# Patient Record
Sex: Female | Born: 1955 | Race: White | Hispanic: No | Marital: Married | Smoking: Never smoker
Health system: Southern US, Community
[De-identification: ages and names within clinical notes are randomized; demographics above are authoritative.]

## PROBLEM LIST (undated history)

## (undated) DIAGNOSIS — I10 Essential (primary) hypertension: Secondary | ICD-10-CM

## (undated) DIAGNOSIS — E119 Type 2 diabetes mellitus without complications: Secondary | ICD-10-CM

## (undated) DIAGNOSIS — M5412 Radiculopathy, cervical region: Secondary | ICD-10-CM

## (undated) DIAGNOSIS — E039 Hypothyroidism, unspecified: Secondary | ICD-10-CM

## (undated) DIAGNOSIS — M7540 Impingement syndrome of unspecified shoulder: Secondary | ICD-10-CM

## (undated) DIAGNOSIS — E785 Hyperlipidemia, unspecified: Secondary | ICD-10-CM

## (undated) DIAGNOSIS — M1990 Unspecified osteoarthritis, unspecified site: Secondary | ICD-10-CM

## (undated) DIAGNOSIS — E7800 Pure hypercholesterolemia, unspecified: Secondary | ICD-10-CM

## (undated) HISTORY — PX: CERVICAL SPINE SURGERY: SHX589

## (undated) HISTORY — PX: OTHER SURGICAL HISTORY: SHX169

## (undated) HISTORY — PX: BREAST EXCISIONAL BIOPSY: SUR124

## (undated) HISTORY — PX: APPENDECTOMY: SHX54

## (undated) HISTORY — PX: COLONOSCOPY: SHX174

## (undated) HISTORY — PX: DILATION AND CURETTAGE OF UTERUS: SHX78

## (undated) HISTORY — PX: CHOLECYSTECTOMY: SHX55

## (undated) HISTORY — PX: ABDOMINAL HYSTERECTOMY: SHX81

---

## 2003-09-06 ENCOUNTER — Other Ambulatory Visit: Payer: Self-pay

## 2005-04-26 ENCOUNTER — Ambulatory Visit: Payer: Self-pay | Admitting: Unknown Physician Specialty

## 2006-01-09 ENCOUNTER — Ambulatory Visit: Payer: Self-pay | Admitting: General Surgery

## 2006-01-17 ENCOUNTER — Other Ambulatory Visit: Payer: Self-pay

## 2006-01-24 ENCOUNTER — Ambulatory Visit: Payer: Self-pay | Admitting: General Surgery

## 2007-11-21 ENCOUNTER — Ambulatory Visit: Payer: Self-pay | Admitting: General Surgery

## 2007-12-09 ENCOUNTER — Ambulatory Visit: Payer: Self-pay | Admitting: Unknown Physician Specialty

## 2008-04-05 ENCOUNTER — Ambulatory Visit: Payer: Self-pay | Admitting: Unknown Physician Specialty

## 2008-12-07 ENCOUNTER — Ambulatory Visit: Payer: Self-pay | Admitting: Unknown Physician Specialty

## 2008-12-12 ENCOUNTER — Ambulatory Visit: Payer: Self-pay | Admitting: Internal Medicine

## 2009-12-15 ENCOUNTER — Ambulatory Visit: Payer: Self-pay | Admitting: Unknown Physician Specialty

## 2011-02-07 ENCOUNTER — Ambulatory Visit: Payer: Self-pay | Admitting: Unknown Physician Specialty

## 2011-03-22 ENCOUNTER — Ambulatory Visit: Payer: Self-pay | Admitting: Unknown Physician Specialty

## 2012-03-13 ENCOUNTER — Ambulatory Visit: Payer: Self-pay | Admitting: Family Medicine

## 2013-03-16 ENCOUNTER — Ambulatory Visit: Payer: Self-pay | Admitting: Family Medicine

## 2013-07-06 ENCOUNTER — Ambulatory Visit: Payer: Self-pay | Admitting: Unknown Physician Specialty

## 2014-03-15 DIAGNOSIS — I10 Essential (primary) hypertension: Secondary | ICD-10-CM | POA: Insufficient documentation

## 2014-03-15 DIAGNOSIS — E039 Hypothyroidism, unspecified: Secondary | ICD-10-CM | POA: Insufficient documentation

## 2014-03-15 DIAGNOSIS — E119 Type 2 diabetes mellitus without complications: Secondary | ICD-10-CM | POA: Insufficient documentation

## 2014-04-28 ENCOUNTER — Ambulatory Visit: Payer: Self-pay | Admitting: Family Medicine

## 2014-11-30 ENCOUNTER — Encounter: Payer: Self-pay | Admitting: *Deleted

## 2014-12-02 ENCOUNTER — Ambulatory Visit
Admit: 2014-12-02 | Disposition: A | Discharge status: Home / Self Care | Payer: Self-pay | Attending: Orthopedic Surgery | Admitting: Orthopedic Surgery

## 2014-12-20 ENCOUNTER — Other Ambulatory Visit
Admission: RE | Admit: 2014-12-20 | Discharge: 2014-12-20 | Disposition: A | Discharge status: Home / Self Care | Payer: 59 | Source: Other Acute Inpatient Hospital | Attending: Neurosurgery | Admitting: Neurosurgery

## 2014-12-20 DIAGNOSIS — M4802 Spinal stenosis, cervical region: Secondary | ICD-10-CM | POA: Insufficient documentation

## 2014-12-20 LAB — BASIC METABOLIC PANEL
Anion gap: 11 (ref 5–15)
BUN: 27 mg/dL — ABNORMAL HIGH (ref 6–20)
CHLORIDE: 109 mmol/L (ref 101–111)
CO2: 21 mmol/L — ABNORMAL LOW (ref 22–32)
CREATININE: 1.17 mg/dL — AB (ref 0.44–1.00)
Calcium: 9.4 mg/dL (ref 8.9–10.3)
GFR calc Af Amer: 58 mL/min — ABNORMAL LOW (ref >60)
GFR, EST NON AFRICAN AMERICAN: 50 mL/min — AB (ref >60)
Glucose, Bld: 128 mg/dL — ABNORMAL HIGH (ref 65–99)
POTASSIUM: 3.7 mmol/L (ref 3.5–5.1)
Sodium: 141 mmol/L (ref 135–145)

## 2014-12-20 LAB — CBC WITH DIFFERENTIAL/PLATELET
BASOS ABS: 0 10*3/uL (ref 0–0.1)
Basophils Relative: 1 %
EOS ABS: 0.4 10*3/uL (ref 0–0.7)
Eosinophils Relative: 6 %
HCT: 39.5 % (ref 35.0–47.0)
HEMOGLOBIN: 12.7 g/dL (ref 12.0–16.0)
LYMPHS ABS: 1.6 10*3/uL (ref 1.0–3.6)
LYMPHS PCT: 24 %
MCH: 28.7 pg (ref 26.0–34.0)
MCHC: 32.3 g/dL (ref 32.0–36.0)
MCV: 88.8 fL (ref 80.0–100.0)
MONO ABS: 0.5 10*3/uL (ref 0.2–0.9)
Monocytes Relative: 8 %
NEUTROS ABS: 4.1 10*3/uL (ref 1.4–6.5)
Neutrophils Relative %: 61 %
PLATELETS: 274 10*3/uL (ref 150–440)
RBC: 4.45 MIL/uL (ref 3.80–5.20)
RDW: 14.1 % (ref 11.5–14.5)
WBC: 6.8 10*3/uL (ref 3.6–11.0)

## 2015-04-18 ENCOUNTER — Other Ambulatory Visit: Payer: Self-pay | Admitting: Family Medicine

## 2015-04-18 DIAGNOSIS — Z1239 Encounter for other screening for malignant neoplasm of breast: Secondary | ICD-10-CM

## 2015-05-02 ENCOUNTER — Other Ambulatory Visit: Payer: Self-pay | Admitting: Family Medicine

## 2015-05-02 ENCOUNTER — Ambulatory Visit
Admission: RE | Admit: 2015-05-02 | Discharge: 2015-05-02 | Disposition: A | Discharge status: Home / Self Care | Payer: 59 | Source: Ambulatory Visit | Attending: Family Medicine | Admitting: Family Medicine

## 2015-05-02 DIAGNOSIS — Z1231 Encounter for screening mammogram for malignant neoplasm of breast: Secondary | ICD-10-CM | POA: Diagnosis present

## 2015-05-02 DIAGNOSIS — Z1239 Encounter for other screening for malignant neoplasm of breast: Secondary | ICD-10-CM

## 2015-07-26 ENCOUNTER — Emergency Department
Admission: EM | Admit: 2015-07-26 | Discharge: 2015-07-26 | Disposition: A | Discharge status: Home / Self Care | Payer: 59 | Attending: Emergency Medicine | Admitting: Emergency Medicine

## 2015-07-26 ENCOUNTER — Encounter: Payer: Self-pay | Admitting: Emergency Medicine

## 2015-07-26 DIAGNOSIS — J029 Acute pharyngitis, unspecified: Secondary | ICD-10-CM | POA: Insufficient documentation

## 2015-07-26 DIAGNOSIS — E119 Type 2 diabetes mellitus without complications: Secondary | ICD-10-CM | POA: Diagnosis not present

## 2015-07-26 DIAGNOSIS — Z7984 Long term (current) use of oral hypoglycemic drugs: Secondary | ICD-10-CM | POA: Diagnosis not present

## 2015-07-26 DIAGNOSIS — Z7982 Long term (current) use of aspirin: Secondary | ICD-10-CM | POA: Diagnosis not present

## 2015-07-26 DIAGNOSIS — Z79899 Other long term (current) drug therapy: Secondary | ICD-10-CM | POA: Diagnosis not present

## 2015-07-26 HISTORY — DX: Type 2 diabetes mellitus without complications: E11.9

## 2015-07-26 LAB — POCT RAPID STREP A: STREPTOCOCCUS, GROUP A SCREEN (DIRECT): NEGATIVE

## 2015-07-26 MED ORDER — IBUPROFEN 800 MG PO TABS
800.0000 mg | ORAL_TABLET | Freq: Once | ORAL | Status: AC
Start: 2015-07-26 — End: 2015-07-26
  Administered 2015-07-26: 800 mg via ORAL
  Filled 2015-07-26: qty 1

## 2015-07-26 MED ORDER — AMOXICILLIN-POT CLAVULANATE 875-125 MG PO TABS: 1.0000 | ORAL_TABLET | Freq: Two times a day (BID) | ORAL | Status: AC

## 2015-07-26 MED ORDER — AMOXICILLIN-POT CLAVULANATE 875-125 MG PO TABS
1.0000 | ORAL_TABLET | Freq: Once | ORAL | Status: AC
  Administered 2015-07-26: 1 via ORAL

## 2015-07-26 MED ORDER — PREDNISONE 20 MG PO TABS: 40.0000 mg | ORAL_TABLET | Freq: Every day | ORAL | Status: AC

## 2015-07-26 MED ORDER — AMOXICILLIN-POT CLAVULANATE 875-125 MG PO TABS
ORAL_TABLET | ORAL | Status: AC
  Filled 2015-07-26: qty 1

## 2015-07-26 MED ORDER — PREDNISONE 20 MG PO TABS
60.0000 mg | ORAL_TABLET | Freq: Once | ORAL | Status: AC
  Administered 2015-07-26: 60 mg via ORAL

## 2015-07-26 MED ORDER — PREDNISONE 20 MG PO TABS
ORAL_TABLET | ORAL | Status: AC
  Administered 2015-07-26: 60 mg via ORAL
  Filled 2015-07-26: qty 3

## 2015-07-26 NOTE — Discharge Instructions (Signed)

## 2015-07-26 NOTE — ED Notes (Signed)
Patient ambulatory to triage with steady gait, without difficulty or distress noted; pt reports cold symptoms since Friday, awoke with sore throat

## 2015-07-26 NOTE — ED Notes (Signed)
MD at bedside. 

## 2015-07-26 NOTE — ED Provider Notes (Signed)
[facility] Emergency Department Provider Note  ____________________________________________  Time seen: 6:15 AM  I have reviewed the triage vital signs and the nursing notes.   HISTORY  Chief Complaint Sore Throat      HPI Amanda Hill is a 59 y.o. female presents with sore throat cough 3 days with acute worsening of her sore throat last night. Patient admits to chills however denies any fever or afebrile on presentation emergency Department temperature 98.3. Patient states she awoke this morning with the sensation as that her throat was closing with considerable discomfort with swallowing     Past Medical History  Diagnosis Date  . Diabetes mellitus without complication ([place])     There are no active problems to display for this patient.   Past Surgical History  Procedure Laterality Date  . Breast biopsy Right [id number]    neg    Current Outpatient Rx  Name  Route  Sig  Dispense  Refill  . aspirin EC 81 MG tablet   Oral   Take 81 mg by mouth daily.         Marland Kitchen levothyroxine (SYNTHROID, LEVOTHROID) 150 MCG tablet   Oral   Take 150 mcg by mouth daily before breakfast.         . linagliptin (TRADJENTA) 5 MG TABS tablet   Oral   Take 5 mg by mouth daily.         Marland Kitchen lisinopril-hydrochlorothiazide (PRINZIDE,ZESTORETIC) 10-12.5 MG tablet   Oral   Take 1 tablet by mouth daily.         . metFORMIN (GLUCOPHAGE) 1000 MG tablet   Oral   Take 1,000 mg by mouth 2 (two) times daily.         . simvastatin (ZOCOR) 10 MG tablet   Oral   Take 10 mg by mouth daily.         Marland Kitchen amoxicillin-clavulanate (AUGMENTIN) 875-125 MG tablet   Oral   Take 1 tablet by mouth 2 (two) times daily.   20 tablet   0   . predniSONE (DELTASONE) 20 MG tablet   Oral   Take 2 tablets (40 mg total) by mouth daily with breakfast.   8 tablet   0     Allergies Dapsone  Family History  Problem Relation Age of Onset  . Breast cancer Maternal  Grandmother 60    Social History Social History  Substance Use Topics  . Smoking status: Never Smoker   . Smokeless tobacco: None  . Alcohol Use: No    Review of Systems  Constitutional: Negative for fever. Positive for chills Eyes: Negative for visual changes. ENT: Positive for sore throat. Cardiovascular: Negative for chest pain. Respiratory: Negative for shortness of breath. Gastrointestinal: Negative for abdominal pain, vomiting and diarrhea. Genitourinary: Negative for dysuria. Musculoskeletal: Negative for back pain. Skin: Negative for rash. Neurological: Negative for headaches, focal weakness or numbness.   10-point ROS otherwise negative.  ____________________________________________   PHYSICAL EXAM:  VITAL SIGNS: ED Triage Vitals  Enc Vitals Group     BP 07/26/15 0417 105/73 mmHg     Pulse Rate 07/26/15 0417 92     Resp 07/26/15 0417 20     Temp 07/26/15 0417 98.3 F (36.8 C)     Temp Source 07/26/15 0417 Oral     SpO2 07/26/15 0417 97 %     Weight 07/26/15 0417 210 lb (95.255 kg)     Height 07/26/15 0417 5\' 3"  (1.6 m)  Head Cir --      Peak Flow --      Pain Score 07/26/15 0417 6     Pain Loc --      Pain Edu? --      Excl. in [place]? --     Constitutional: Alert and oriented. Well appearing and in no distress. Eyes: Conjunctivae are normal. PERRL. Normal extraocular movements. ENT   Head: Normocephalic and atraumatic.   Nose: No congestion/rhinnorhea.   Mouth/Throat: Mucous membranes are moist. Pharyngeal erythema with exudate   Neck: No stridor. Hematological/Lymphatic/Immunilogical: No cervical lymphadenopathy. Cardiovascular: Normal rate, regular rhythm. Normal and symmetric distal pulses are present in all extremities. No murmurs, rubs, or gallops. Respiratory: Normal respiratory effort without tachypnea nor retractions. Breath sounds are clear and equal bilaterally. No wheezes/rales/rhonchi. Gastrointestinal: Soft and nontender.  No distention. There is no CVA tenderness. Genitourinary: deferred Musculoskeletal: Nontender with normal range of motion in all extremities. No joint effusions.  No lower extremity tenderness nor edema. Neurologic:  Normal speech and language. No gross focal neurologic deficits are appreciated. Speech is normal.  Skin:  Skin is warm, dry and intact. No rash noted. Psychiatric: Mood and affect are normal. Speech and behavior are normal. Patient exhibits appropriate insight and judgment.  ____________________________________________    LABS (pertinent positives/negatives) Labs Reviewed  CULTURE, GROUP A STREP ([facility] ONLY)  POCT RAPID STREP A      INITIAL IMPRESSION / ASSESSMENT AND PLAN / ED COURSE  Pertinent labs & imaging results that were available during my care of the patient were reviewed by me and considered in my medical decision making (see chart for details).  History of physical exam consistent with pharyngitis. Given the level of patient's discomfort was prednisone was given for inflammation. Patient was advised to be very diligent regarding checking her glucose and treating accordingly. Patient is an Therapist, sports. In addition patient received Augmentin 875 mg and will be prescribe the same  ____________________________________________   FINAL CLINICAL IMPRESSION(S) / ED DIAGNOSES  Final diagnoses:  Acute pharyngitis, unspecified pharyngitis type      Gregor Hams, MD 07/26/15 [phone]

## 2015-07-26 NOTE — ED Notes (Signed)
Patient with no complaints at this time. Respirations even and unlabored. Skin warm/dry. Discharge instructions reviewed with patient at this time. Patient given opportunity to voice concerns/ask questions. Patient discharged at this time and left Emergency Department with steady gait.   

## 2015-07-28 LAB — CULTURE, GROUP A STREP (THRC)

## 2015-10-11 DIAGNOSIS — E119 Type 2 diabetes mellitus without complications: Secondary | ICD-10-CM | POA: Diagnosis not present

## 2015-10-11 DIAGNOSIS — I10 Essential (primary) hypertension: Secondary | ICD-10-CM | POA: Diagnosis not present

## 2015-10-19 DIAGNOSIS — E119 Type 2 diabetes mellitus without complications: Secondary | ICD-10-CM | POA: Diagnosis not present

## 2015-10-19 DIAGNOSIS — I10 Essential (primary) hypertension: Secondary | ICD-10-CM | POA: Diagnosis not present

## 2015-10-19 DIAGNOSIS — E785 Hyperlipidemia, unspecified: Secondary | ICD-10-CM | POA: Diagnosis not present

## 2015-10-19 DIAGNOSIS — E039 Hypothyroidism, unspecified: Secondary | ICD-10-CM | POA: Diagnosis not present

## 2015-10-31 DIAGNOSIS — I10 Essential (primary) hypertension: Secondary | ICD-10-CM | POA: Diagnosis not present

## 2016-02-17 DIAGNOSIS — H2512 Age-related nuclear cataract, left eye: Secondary | ICD-10-CM | POA: Diagnosis not present

## 2016-03-22 DIAGNOSIS — H2512 Age-related nuclear cataract, left eye: Secondary | ICD-10-CM | POA: Diagnosis not present

## 2016-03-29 ENCOUNTER — Ambulatory Visit: Payer: 59 | Admitting: Anesthesiology

## 2016-03-29 ENCOUNTER — Encounter
Admission: RE | Disposition: A | Discharge status: Home / Self Care | Payer: Self-pay | Source: Ambulatory Visit | Attending: Ophthalmology

## 2016-03-29 ENCOUNTER — Encounter: Payer: Self-pay | Admitting: Anesthesiology

## 2016-03-29 ENCOUNTER — Ambulatory Visit
Admission: RE | Admit: 2016-03-29 | Discharge: 2016-03-29 | Disposition: A | Discharge status: Home / Self Care | Payer: 59 | Source: Ambulatory Visit | Attending: Ophthalmology | Admitting: Ophthalmology

## 2016-03-29 DIAGNOSIS — M1990 Unspecified osteoarthritis, unspecified site: Secondary | ICD-10-CM | POA: Insufficient documentation

## 2016-03-29 DIAGNOSIS — E7800 Pure hypercholesterolemia, unspecified: Secondary | ICD-10-CM | POA: Insufficient documentation

## 2016-03-29 DIAGNOSIS — I10 Essential (primary) hypertension: Secondary | ICD-10-CM | POA: Diagnosis not present

## 2016-03-29 DIAGNOSIS — E119 Type 2 diabetes mellitus without complications: Secondary | ICD-10-CM | POA: Diagnosis not present

## 2016-03-29 DIAGNOSIS — E1136 Type 2 diabetes mellitus with diabetic cataract: Secondary | ICD-10-CM | POA: Insufficient documentation

## 2016-03-29 DIAGNOSIS — E669 Obesity, unspecified: Secondary | ICD-10-CM | POA: Diagnosis not present

## 2016-03-29 DIAGNOSIS — Z9049 Acquired absence of other specified parts of digestive tract: Secondary | ICD-10-CM | POA: Insufficient documentation

## 2016-03-29 DIAGNOSIS — H2512 Age-related nuclear cataract, left eye: Secondary | ICD-10-CM | POA: Diagnosis not present

## 2016-03-29 DIAGNOSIS — Z6837 Body mass index (BMI) 37.0-37.9, adult: Secondary | ICD-10-CM | POA: Diagnosis not present

## 2016-03-29 DIAGNOSIS — Z888 Allergy status to other drugs, medicaments and biological substances status: Secondary | ICD-10-CM | POA: Diagnosis not present

## 2016-03-29 DIAGNOSIS — Z90710 Acquired absence of both cervix and uterus: Secondary | ICD-10-CM | POA: Diagnosis not present

## 2016-03-29 DIAGNOSIS — E079 Disorder of thyroid, unspecified: Secondary | ICD-10-CM | POA: Diagnosis not present

## 2016-03-29 HISTORY — DX: Essential (primary) hypertension: I10

## 2016-03-29 HISTORY — PX: CATARACT EXTRACTION W/PHACO: SHX586

## 2016-03-29 HISTORY — DX: Unspecified osteoarthritis, unspecified site: M19.90

## 2016-03-29 HISTORY — DX: Pure hypercholesterolemia, unspecified: E78.00

## 2016-03-29 HISTORY — DX: Hypothyroidism, unspecified: E03.9

## 2016-03-29 LAB — GLUCOSE, CAPILLARY: GLUCOSE-CAPILLARY: 122 mg/dL — AB (ref 65–99)

## 2016-03-29 SURGERY — PHACOEMULSIFICATION, CATARACT, WITH IOL INSERTION
Anesthesia: Monitor Anesthesia Care | Site: Eye | Laterality: Left | Wound class: Clean

## 2016-03-29 MED ORDER — CEFUROXIME OPHTHALMIC INJECTION 1 MG/0.1 ML
INJECTION | OPHTHALMIC | Status: DC | PRN
  Administered 2016-03-29: .1 mL via INTRACAMERAL

## 2016-03-29 MED ORDER — CEFUROXIME OPHTHALMIC INJECTION 1 MG/0.1 ML
INJECTION | OPHTHALMIC | Status: AC
  Filled 2016-03-29: qty 0.1

## 2016-03-29 MED ORDER — LIDOCAINE HCL (PF) 4 % IJ SOLN
INTRAMUSCULAR | Status: AC
  Filled 2016-03-29: qty 5

## 2016-03-29 MED ORDER — POVIDONE-IODINE 5 % OP SOLN
1.0000 "application " | OPHTHALMIC | Status: AC | PRN
  Administered 2016-03-29: 1 via OPHTHALMIC

## 2016-03-29 MED ORDER — NA CHONDROIT SULF-NA HYALURON 40-17 MG/ML IO SOLN
INTRAOCULAR | Status: DC | PRN
  Administered 2016-03-29: 1 mL via INTRAOCULAR

## 2016-03-29 MED ORDER — MIDAZOLAM HCL 2 MG/2ML IJ SOLN
INTRAMUSCULAR | Status: DC | PRN
  Administered 2016-03-29: 2 mg via INTRAVENOUS

## 2016-03-29 MED ORDER — EPINEPHRINE HCL 1 MG/ML IJ SOLN
INTRAMUSCULAR | Status: AC
  Filled 2016-03-29: qty 2

## 2016-03-29 MED ORDER — SODIUM CHLORIDE 0.9 % IV SOLN
INTRAVENOUS | Status: DC
  Administered 2016-03-29: 11:00:00 via INTRAVENOUS

## 2016-03-29 MED ORDER — FENTANYL CITRATE (PF) 100 MCG/2ML IJ SOLN
INTRAMUSCULAR | Status: DC | PRN
  Administered 2016-03-29 (×2): 50 ug via INTRAVENOUS

## 2016-03-29 MED ORDER — POVIDONE-IODINE 5 % OP SOLN
OPHTHALMIC | Status: AC
  Filled 2016-03-29: qty 30

## 2016-03-29 MED ORDER — EPINEPHRINE HCL 1 MG/ML IJ SOLN
INTRAMUSCULAR | Status: DC | PRN
  Administered 2016-03-29: 1 mL via OPHTHALMIC

## 2016-03-29 MED ORDER — MOXIFLOXACIN HCL 0.5 % OP SOLN
1.0000 [drp] | OPHTHALMIC | Status: AC | PRN
  Administered 2016-03-29: 1 [drp] via OPHTHALMIC
  Filled 2016-03-29: qty 3

## 2016-03-29 MED ORDER — BSS IO SOLN
INTRAOCULAR | Status: DC | PRN
  Administered 2016-03-29: .5 mL via OPHTHALMIC

## 2016-03-29 MED ORDER — NA CHONDROIT SULF-NA HYALURON 40-17 MG/ML IO SOLN
INTRAOCULAR | Status: AC
  Filled 2016-03-29: qty 1

## 2016-03-29 MED ORDER — MOXIFLOXACIN HCL 0.5 % OP SOLN
OPHTHALMIC | Status: AC
  Filled 2016-03-29: qty 3

## 2016-03-29 MED ORDER — CARBACHOL 0.01 % IO SOLN
INTRAOCULAR | Status: DC | PRN
  Administered 2016-03-29: .5 mL via INTRAOCULAR

## 2016-03-29 MED ORDER — ARMC OPHTHALMIC DILATING GEL
1.0000 "application " | OPHTHALMIC | Status: AC | PRN
  Administered 2016-03-29 (×2): 1 via OPHTHALMIC

## 2016-03-29 MED ORDER — MOXIFLOXACIN HCL 0.5 % OP SOLN
OPHTHALMIC | Status: DC | PRN
  Administered 2016-03-29: 1 [drp] via OPHTHALMIC

## 2016-03-29 MED ORDER — TETRACAINE HCL 0.5 % OP SOLN
1.0000 [drp] | OPHTHALMIC | Status: AC | PRN
  Administered 2016-03-29: 1 [drp] via OPHTHALMIC

## 2016-03-29 MED ORDER — ARMC OPHTHALMIC DILATING GEL
OPHTHALMIC | Status: AC
  Filled 2016-03-29: qty 0.25

## 2016-03-29 MED ORDER — TETRACAINE HCL 0.5 % OP SOLN
OPHTHALMIC | Status: AC
  Filled 2016-03-29: qty 2

## 2016-03-29 SURGICAL SUPPLY — 21 items
CANNULA ANT/CHMB 27GA (MISCELLANEOUS) ×2 IMPLANT
CUP MEDICINE 2OZ PLAST GRAD ST (MISCELLANEOUS) ×2 IMPLANT
GLOVE BIO SURGEON STRL SZ8 (GLOVE) ×2 IMPLANT
GLOVE BIOGEL M 6.5 STRL (GLOVE) ×2 IMPLANT
GLOVE SURG LX 8.0 MICRO (GLOVE) ×1
GLOVE SURG LX STRL 8.0 MICRO (GLOVE) ×1 IMPLANT
GOWN STRL REUS W/ TWL LRG LVL3 (GOWN DISPOSABLE) ×2 IMPLANT
GOWN STRL REUS W/TWL LRG LVL3 (GOWN DISPOSABLE) ×2
LENS IOL TECNIS SYMFONY 14.5 (Intraocular Lens) ×2 IMPLANT
PACK CATARACT (MISCELLANEOUS) ×2 IMPLANT
PACK CATARACT BRASINGTON LX (MISCELLANEOUS) ×2 IMPLANT
PACK EYE AFTER SURG (MISCELLANEOUS) ×2 IMPLANT
SOL BSS BAG (MISCELLANEOUS) ×2
SOL PREP PVP 2OZ (MISCELLANEOUS) ×2
SOLUTION BSS BAG (MISCELLANEOUS) ×1 IMPLANT
SOLUTION PREP PVP 2OZ (MISCELLANEOUS) ×1 IMPLANT
SYR 3ML LL SCALE MARK (SYRINGE) ×2 IMPLANT
SYR 5ML LL (SYRINGE) ×2 IMPLANT
SYR TB 1ML 27GX1/2 LL (SYRINGE) ×2 IMPLANT
WATER STERILE IRR 1000ML POUR (IV SOLUTION) ×2 IMPLANT
WIPE NON LINTING 3.25X3.25 (MISCELLANEOUS) ×2 IMPLANT

## 2016-03-29 NOTE — H&P (Signed)
  All labs reviewed. Abnormal studies sent to patients PCP when indicated.  Previous H&P reviewed, patient examined, there are NO CHANGES.  Amanda Hill LOUIS8/10/201711:05 AM

## 2016-03-29 NOTE — Anesthesia Postprocedure Evaluation (Signed)
Anesthesia Post Note  Patient: Amanda Hill  Procedure(s) Performed: Procedure(s) (LRB): CATARACT EXTRACTION PHACO AND INTRAOCULAR LENS PLACEMENT (IOC) (Left)  Patient location during evaluation: Other Anesthesia Type: MAC Level of consciousness: awake and alert Pain management: pain level controlled Vital Signs Assessment: post-procedure vital signs reviewed and stable Respiratory status: spontaneous breathing, nonlabored ventilation, respiratory function stable and patient connected to nasal cannula oxygen Cardiovascular status: stable and blood pressure returned to baseline Anesthetic complications: no    Last Vitals:  Vitals:   03/29/16 1149 03/29/16 1200  BP: 100/71 106/61  Pulse: 65 66  Resp: 18 18  Temp:      Last Pain:  Vitals:   03/29/16 1200  TempSrc:   PainSc: 0-No pain                 Borghild Thaker S

## 2016-03-29 NOTE — Op Note (Signed)
PREOPERATIVE DIAGNOSIS:  Nuclear sclerotic cataract of the left eye.   POSTOPERATIVE DIAGNOSIS:  nuclear sclerotic cataract left eye   OPERATIVE PROCEDURE:  Procedure(s): CATARACT EXTRACTION PHACO AND INTRAOCULAR LENS PLACEMENT (IOC)   SURGEON:  Birder Robson, MD.   ANESTHESIA:   Anesthesiologist: Gunnar Bulla, MD CRNA: Vaughan Sine  1.      Managed anesthesia care. 2.      Topical tetracaine drops followed by 2% Xylocaine jelly applied in the preoperative holding area.       3.  0.2 ml of epi-Shugarcaine was  placed in the anterior chamber following the paracentesis.    COMPLICATIONS:  None.   TECHNIQUE:   Stop and chop   DESCRIPTION OF PROCEDURE:  The patient was examined and consented in the preoperative holding area where the aforementioned topical anesthesia was applied to the left eye and then brought back to the Operating Room where the left eye was prepped and draped in the usual sterile ophthalmic fashion and a lid speculum was placed. A paracentesis was created with the side port blade and the anterior chamber was filled with viscoelastic. A near clear corneal incision was performed with the steel keratome. A continuous curvilinear capsulorrhexis was performed with a cystotome followed by the capsulorrhexis forceps. Hydrodissection and hydrodelineation were carried out with BSS on a blunt cannula. The lens was removed in a stop and chop  technique and the remaining cortical material was removed with the irrigation-aspiration handpiece. The capsular bag was inflated with viscoelastic and the Technis [id number] lens was placed in the capsular bag without complication. The remaining viscoelastic was removed from the eye with the irrigation-aspiration handpiece. The wounds were hydrated. The anterior chamber was flushed with Miostat and the eye was inflated to physiologic pressure. 0.1 mL of cefuroxime concentration 10 mg/mL was placed in the anterior chamber. The wounds were found  to be water tight. The eye was dressed with Vigamox. The patient was given protective glasses to wear throughout the day and a shield with which to sleep tonight. The patient was also given drops with which to begin a drop regimen today and will follow-up with me in one day.  Implant Name Type Inv. Item Serial No. Manufacturer Lot No. LRB No. Used  LENS IOL SYMFONY 14.5 - [id number] Intraocular Lens LENS IOL SYMFONY 14.5 [id number]   Left 1   Procedure(s) with comments: CATARACT EXTRACTION PHACO AND INTRAOCULAR LENS PLACEMENT (IOC) (Left) - [place] 00:43 AP% 18.7 CDE 8.18 Fluid pack lot # [id number]  Electronically signed: [place] 03/29/2016 11:33 AM

## 2016-03-29 NOTE — Transfer of Care (Signed)
Immediate Anesthesia Transfer of Care Note  Patient: Amanda Hill  Procedure(s) Performed: Procedure(s) with comments: CATARACT EXTRACTION PHACO AND INTRAOCULAR LENS PLACEMENT (IOC) (Left) - [place] 00:43 AP% 18.7 CDE 8.18 Fluid pack lot # [id number]  Patient Location: PACU and Short Stay  Anesthesia Type:MAC  Level of Consciousness: awake, alert , oriented and sedated  Airway & Oxygen Therapy: Patient Spontanous Breathing  Post-op Assessment: Report given to RN and Post -op Vital signs reviewed and stable  Post vital signs: Reviewed and stable  Last Vitals:  Vitals:   03/29/16 0956  BP: (!) 104/49  Pulse: 77  Resp: 14  Temp: 36.8 C    Last Pain:  Vitals:   03/29/16 0956  TempSrc: Oral         Complications: No apparent anesthesia complications

## 2016-03-29 NOTE — Discharge Instructions (Signed)
Eye Surgery Discharge Instructions  Expect mild scratchy sensation or mild soreness. DO NOT RUB YOUR EYE!  The day of surgery:  Minimal physical activity, but bed rest is not required  No reading, computer work, or close hand work  No bending, lifting, or straining.  May watch TV  For 24 hours:  No driving, legal decisions, or alcoholic beverages  Safety precautions  Eat anything you prefer: It is better to start with liquids, then soup then solid foods.  _____ Eye patch should be worn until postoperative exam tomorrow.  ____ Solar shield eyeglasses should be worn for comfort in the sunlight/patch while sleeping  Resume all regular medications including aspirin or Coumadin if these were discontinued prior to surgery. You may shower, bathe, shave, or wash your hair. Tylenol may be taken for mild discomfort.  Call your doctor if you experience significant pain, nausea, or vomiting, fever > 101 or other signs of infection. [phone] or [phone] Specific instructions:  Follow-up Information    Tim Lair, MD .   Specialty:  Ophthalmology Why:  Tomorrow Aug.11,2017 at 10:00 am Contact information: [place] [address] [phone]

## 2016-03-29 NOTE — Anesthesia Preprocedure Evaluation (Signed)
Anesthesia Evaluation  Patient identified by MRN, date of birth, ID band Patient awake    Reviewed: Allergy & Precautions, NPO status , Patient's Chart, lab work & pertinent test results, reviewed documented beta blocker date and time   Airway Mallampati: III  TM Distance: >3 FB     Dental  (+) Chipped   Pulmonary           Cardiovascular hypertension, Pt. on medications      Neuro/Psych    GI/Hepatic   Endo/Other  diabetes, Type 2Hypothyroidism   Renal/GU      Musculoskeletal  (+) Arthritis ,   Abdominal   Peds  Hematology   Anesthesia Other Findings Obese.  Reproductive/Obstetrics                             Anesthesia Physical Anesthesia Plan  ASA: III  Anesthesia Plan: MAC   Post-op Pain Management:    Induction:   Airway Management Planned:   Additional Equipment:   Intra-op Plan:   Post-operative Plan:   Informed Consent: I have reviewed the patients History and Physical, chart, labs and discussed the procedure including the risks, benefits and alternatives for the proposed anesthesia with the patient or authorized representative who has indicated his/her understanding and acceptance.     Plan Discussed with: CRNA  Anesthesia Plan Comments:         Anesthesia Quick Evaluation

## 2016-04-09 DIAGNOSIS — E785 Hyperlipidemia, unspecified: Secondary | ICD-10-CM | POA: Diagnosis not present

## 2016-04-09 DIAGNOSIS — I10 Essential (primary) hypertension: Secondary | ICD-10-CM | POA: Diagnosis not present

## 2016-04-09 DIAGNOSIS — E119 Type 2 diabetes mellitus without complications: Secondary | ICD-10-CM | POA: Diagnosis not present

## 2016-04-11 DIAGNOSIS — H2511 Age-related nuclear cataract, right eye: Secondary | ICD-10-CM | POA: Diagnosis not present

## 2016-04-17 ENCOUNTER — Encounter: Payer: Self-pay | Admitting: *Deleted

## 2016-04-18 DIAGNOSIS — E039 Hypothyroidism, unspecified: Secondary | ICD-10-CM | POA: Diagnosis not present

## 2016-04-18 DIAGNOSIS — Z0000 Encounter for general adult medical examination without abnormal findings: Secondary | ICD-10-CM | POA: Diagnosis not present

## 2016-04-18 DIAGNOSIS — E119 Type 2 diabetes mellitus without complications: Secondary | ICD-10-CM | POA: Diagnosis not present

## 2016-04-18 DIAGNOSIS — I10 Essential (primary) hypertension: Secondary | ICD-10-CM | POA: Diagnosis not present

## 2016-04-19 ENCOUNTER — Other Ambulatory Visit: Payer: Self-pay | Admitting: Family Medicine

## 2016-04-19 DIAGNOSIS — Z1239 Encounter for other screening for malignant neoplasm of breast: Secondary | ICD-10-CM

## 2016-04-24 ENCOUNTER — Ambulatory Visit: Payer: 59 | Admitting: Anesthesiology

## 2016-04-24 ENCOUNTER — Encounter
Admission: RE | Disposition: A | Discharge status: Home / Self Care | Payer: Self-pay | Source: Ambulatory Visit | Attending: Ophthalmology

## 2016-04-24 ENCOUNTER — Encounter: Payer: Self-pay | Admitting: *Deleted

## 2016-04-24 ENCOUNTER — Ambulatory Visit
Admission: RE | Admit: 2016-04-24 | Discharge: 2016-04-24 | Disposition: A | Discharge status: Home / Self Care | Payer: 59 | Source: Ambulatory Visit | Attending: Ophthalmology | Admitting: Ophthalmology

## 2016-04-24 DIAGNOSIS — Z888 Allergy status to other drugs, medicaments and biological substances status: Secondary | ICD-10-CM | POA: Insufficient documentation

## 2016-04-24 DIAGNOSIS — E7800 Pure hypercholesterolemia, unspecified: Secondary | ICD-10-CM | POA: Diagnosis not present

## 2016-04-24 DIAGNOSIS — Z6836 Body mass index (BMI) 36.0-36.9, adult: Secondary | ICD-10-CM | POA: Insufficient documentation

## 2016-04-24 DIAGNOSIS — E669 Obesity, unspecified: Secondary | ICD-10-CM | POA: Insufficient documentation

## 2016-04-24 DIAGNOSIS — H2511 Age-related nuclear cataract, right eye: Secondary | ICD-10-CM | POA: Diagnosis not present

## 2016-04-24 DIAGNOSIS — M1990 Unspecified osteoarthritis, unspecified site: Secondary | ICD-10-CM | POA: Insufficient documentation

## 2016-04-24 DIAGNOSIS — Z9049 Acquired absence of other specified parts of digestive tract: Secondary | ICD-10-CM | POA: Insufficient documentation

## 2016-04-24 DIAGNOSIS — Z9842 Cataract extraction status, left eye: Secondary | ICD-10-CM | POA: Insufficient documentation

## 2016-04-24 DIAGNOSIS — I10 Essential (primary) hypertension: Secondary | ICD-10-CM | POA: Insufficient documentation

## 2016-04-24 DIAGNOSIS — Z90710 Acquired absence of both cervix and uterus: Secondary | ICD-10-CM | POA: Insufficient documentation

## 2016-04-24 DIAGNOSIS — E039 Hypothyroidism, unspecified: Secondary | ICD-10-CM | POA: Diagnosis not present

## 2016-04-24 DIAGNOSIS — E1136 Type 2 diabetes mellitus with diabetic cataract: Secondary | ICD-10-CM | POA: Insufficient documentation

## 2016-04-24 DIAGNOSIS — E119 Type 2 diabetes mellitus without complications: Secondary | ICD-10-CM | POA: Diagnosis not present

## 2016-04-24 HISTORY — PX: CATARACT EXTRACTION W/PHACO: SHX586

## 2016-04-24 LAB — GLUCOSE, CAPILLARY: Glucose-Capillary: 119 mg/dL — ABNORMAL HIGH (ref 65–99)

## 2016-04-24 SURGERY — PHACOEMULSIFICATION, CATARACT, WITH IOL INSERTION
Anesthesia: Monitor Anesthesia Care | Site: Eye | Laterality: Right | Wound class: Clean

## 2016-04-24 MED ORDER — CARBACHOL 0.01 % IO SOLN
INTRAOCULAR | Status: DC | PRN
  Administered 2016-04-24: .5 mL via INTRAOCULAR

## 2016-04-24 MED ORDER — CEFUROXIME OPHTHALMIC INJECTION 1 MG/0.1 ML
INJECTION | OPHTHALMIC | Status: DC | PRN
  Administered 2016-04-24: .1 mL via INTRACAMERAL

## 2016-04-24 MED ORDER — CEFUROXIME OPHTHALMIC INJECTION 1 MG/0.1 ML
INJECTION | OPHTHALMIC | Status: AC
  Filled 2016-04-24: qty 0.1

## 2016-04-24 MED ORDER — EPINEPHRINE HCL 1 MG/ML IJ SOLN
INTRAMUSCULAR | Status: AC
  Filled 2016-04-24: qty 2

## 2016-04-24 MED ORDER — NA CHONDROIT SULF-NA HYALURON 40-17 MG/ML IO SOLN
INTRAOCULAR | Status: DC | PRN
  Administered 2016-04-24: 1 mL via INTRAOCULAR

## 2016-04-24 MED ORDER — ARMC OPHTHALMIC DILATING GEL
1.0000 "application " | OPHTHALMIC | Status: AC | PRN
  Administered 2016-04-24 (×2): 1 via OPHTHALMIC

## 2016-04-24 MED ORDER — MOXIFLOXACIN HCL 0.5 % OP SOLN
OPHTHALMIC | Status: DC | PRN
  Administered 2016-04-24: 1 [drp] via OPHTHALMIC

## 2016-04-24 MED ORDER — NA CHONDROIT SULF-NA HYALURON 40-17 MG/ML IO SOLN
INTRAOCULAR | Status: AC
  Filled 2016-04-24: qty 1

## 2016-04-24 MED ORDER — MOXIFLOXACIN HCL 0.5 % OP SOLN
OPHTHALMIC | Status: AC
  Filled 2016-04-24: qty 3

## 2016-04-24 MED ORDER — EPINEPHRINE HCL 1 MG/ML IJ SOLN
INTRAOCULAR | Status: DC | PRN
  Administered 2016-04-24: 1 mL via OPHTHALMIC

## 2016-04-24 MED ORDER — TETRACAINE HCL 0.5 % OP SOLN
OPHTHALMIC | Status: AC
  Administered 2016-04-24: 09:00:00
  Filled 2016-04-24: qty 2

## 2016-04-24 MED ORDER — MIDAZOLAM HCL 2 MG/2ML IJ SOLN
INTRAMUSCULAR | Status: DC | PRN
  Administered 2016-04-24: 2 mg via INTRAVENOUS

## 2016-04-24 MED ORDER — POVIDONE-IODINE 5 % OP SOLN
1.0000 "application " | Freq: Once | OPHTHALMIC | Status: AC
  Administered 2016-04-24: 1 via OPHTHALMIC

## 2016-04-24 MED ORDER — TETRACAINE HCL 0.5 % OP SOLN
1.0000 [drp] | Freq: Once | OPHTHALMIC | Status: AC
  Administered 2016-04-24: 1 [drp] via OPHTHALMIC

## 2016-04-24 MED ORDER — POVIDONE-IODINE 5 % OP SOLN
OPHTHALMIC | Status: AC
  Filled 2016-04-24: qty 30

## 2016-04-24 MED ORDER — FENTANYL CITRATE (PF) 100 MCG/2ML IJ SOLN
INTRAMUSCULAR | Status: DC | PRN
Start: 2016-04-24 — End: 2016-04-24
  Administered 2016-04-24: 50 ug via INTRAVENOUS

## 2016-04-24 MED ORDER — SODIUM CHLORIDE 0.9 % IV SOLN
INTRAVENOUS | Status: DC
  Administered 2016-04-24 (×2): via INTRAVENOUS

## 2016-04-24 MED ORDER — LIDOCAINE HCL (PF) 4 % IJ SOLN
INTRAMUSCULAR | Status: AC
  Filled 2016-04-24: qty 5

## 2016-04-24 MED ORDER — ARMC OPHTHALMIC DILATING GEL
OPHTHALMIC | Status: AC
  Administered 2016-04-24: 09:00:00
  Filled 2016-04-24: qty 0.25

## 2016-04-24 MED ORDER — MOXIFLOXACIN HCL 0.5 % OP SOLN: 1.0000 [drp] | OPHTHALMIC | Status: DC | PRN

## 2016-04-24 MED ORDER — LIDOCAINE HCL (PF) 4 % IJ SOLN
INTRAOCULAR | Status: DC | PRN
  Administered 2016-04-24: .5 mL via OPHTHALMIC

## 2016-04-24 SURGICAL SUPPLY — 21 items
CANNULA ANT/CHMB 27GA (MISCELLANEOUS) ×2 IMPLANT
CUP MEDICINE 2OZ PLAST GRAD ST (MISCELLANEOUS) ×2 IMPLANT
GLOVE BIO SURGEON STRL SZ8 (GLOVE) ×2 IMPLANT
GLOVE BIOGEL M 6.5 STRL (GLOVE) ×2 IMPLANT
GLOVE SURG LX 8.0 MICRO (GLOVE) ×1
GLOVE SURG LX STRL 8.0 MICRO (GLOVE) ×1 IMPLANT
GOWN STRL REUS W/ TWL LRG LVL3 (GOWN DISPOSABLE) ×2 IMPLANT
GOWN STRL REUS W/TWL LRG LVL3 (GOWN DISPOSABLE) ×2
LENS IOL TECNIS SYMFONY 17.5 (Intraocular Lens) ×2 IMPLANT
PACK CATARACT (MISCELLANEOUS) ×2 IMPLANT
PACK CATARACT BRASINGTON LX (MISCELLANEOUS) ×2 IMPLANT
PACK EYE AFTER SURG (MISCELLANEOUS) ×2 IMPLANT
SOL BSS BAG (MISCELLANEOUS) ×2
SOL PREP PVP 2OZ (MISCELLANEOUS) ×2
SOLUTION BSS BAG (MISCELLANEOUS) ×1 IMPLANT
SOLUTION PREP PVP 2OZ (MISCELLANEOUS) ×1 IMPLANT
SYR 3ML LL SCALE MARK (SYRINGE) ×2 IMPLANT
SYR 5ML LL (SYRINGE) ×2 IMPLANT
SYR TB 1ML 27GX1/2 LL (SYRINGE) ×2 IMPLANT
WATER STERILE IRR 250ML POUR (IV SOLUTION) ×2 IMPLANT
WIPE NON LINTING 3.25X3.25 (MISCELLANEOUS) ×2 IMPLANT

## 2016-04-24 NOTE — Op Note (Signed)
PREOPERATIVE DIAGNOSIS:  Nuclear sclerotic cataract of the right eye.   POSTOPERATIVE DIAGNOSIS: nuclear sclerotic cataract right eye   OPERATIVE PROCEDURE:  Procedure(s): CATARACT EXTRACTION PHACO AND INTRAOCULAR LENS PLACEMENT (IOC)   SURGEON:  Birder Robson, MD.   ANESTHESIA:  Anesthesiologist: Alvin Critchley, MD CRNA: Allean Found, CRNA  1.      Managed anesthesia care. 2.      Topical tetracaine drops followed by 2% Xylocaine jelly applied in the preoperative holding area.      3.   0.2 ml of epi-Shugarcaine was  placed in the anterior chamber following the paracentesis.    COMPLICATIONS:  None.   TECHNIQUE:   Stop and chop   DESCRIPTION OF PROCEDURE:  The patient was examined and consented in the preoperative holding area where the aforementioned topical anesthesia was applied to the right eye and then brought back to the Operating Room where the right eye was prepped and draped in the usual sterile ophthalmic fashion and a lid speculum was placed. A paracentesis was created with the side port blade and the anterior chamber was filled with viscoelastic. A near clear corneal incision was performed with the steel keratome. A continuous curvilinear capsulorrhexis was performed with a cystotome followed by the capsulorrhexis forceps. Hydrodissection and hydrodelineation were carried out with BSS on a blunt cannula. The lens was removed in a stop and chop  technique and the remaining cortical material was removed with the irrigation-aspiration handpiece. The capsular bag was inflated with viscoelastic and the Technis ZCB00  lens was placed in the capsular bag without complication. The remaining viscoelastic was removed from the eye with the irrigation-aspiration handpiece. The wounds were hydrated. The anterior chamber was flushed with Miostat and the eye was inflated to physiologic pressure. 0.1 mL of cefuroxime concentration 10 mg/mL was placed in the anterior chamber. The wounds were  found to be water tight. The eye was dressed with Vigamox. The patient was given protective glasses to wear throughout the day and a shield with which to sleep tonight. The patient was also given drops with which to begin a drop regimen today and will follow-up with me in one day.  Implant Name Type Inv. Item Serial No. Manufacturer Lot No. LRB No. Used  LENS IOL SYMFONY 17.5 - [id number] Intraocular Lens LENS IOL SYMFONY 17.5 [id number]   Right 1   Procedure(s) with comments: CATARACT EXTRACTION PHACO AND INTRAOCULAR LENS PLACEMENT (IOC) (Right) - [place]    00:49.4 AP%     14.1 CDE      6.96 Fluid pack lot # [id number]  Electronically signed: [place] 04/24/2016 9:48 AM

## 2016-04-24 NOTE — Anesthesia Postprocedure Evaluation (Signed)
Anesthesia Post Note  Patient: Amanda Hill  Procedure(s) Performed: Procedure(s) (LRB): CATARACT EXTRACTION PHACO AND INTRAOCULAR LENS PLACEMENT (IOC) (Right)  Patient location during evaluation: PACU Level of consciousness: awake Pain management: pain level controlled Vital Signs Assessment: post-procedure vital signs reviewed and stable Respiratory status: spontaneous breathing Cardiovascular status: blood pressure returned to baseline and stable Postop Assessment: no headache Anesthetic complications: no    Last Vitals:  Vitals:   04/24/16 0834  BP: 111/76  Pulse: 71  Resp: 18  Temp: 36.8 C    Last Pain:  Vitals:   04/24/16 0834  TempSrc: Oral                 Buckner [place]

## 2016-04-24 NOTE — Transfer of Care (Signed)
Immediate Anesthesia Transfer of Care Note  Patient: Amanda Hill  Procedure(s) Performed: Procedure(s) with comments: CATARACT EXTRACTION PHACO AND INTRAOCULAR LENS PLACEMENT (IOC) (Right) - [place]    00:49.4 AP%     14.1 CDE      6.96 Fluid pack lot # [id number]  Patient Location: PACU  Anesthesia Type:MAC  Level of Consciousness: awake  Airway & Oxygen Therapy: Patient Spontanous Breathing  Post-op Assessment: Report given to RN  Post vital signs: Reviewed and stable  Last Vitals:  Vitals:   04/24/16 0834  BP: 111/76  Pulse: 71  Resp: 18  Temp: 36.8 C    Last Pain:  Vitals:   04/24/16 0834  TempSrc: Oral         Complications: No apparent anesthesia complications

## 2016-04-24 NOTE — Discharge Instructions (Signed)

## 2016-04-24 NOTE — Anesthesia Procedure Notes (Signed)
Procedure Name: MAC Date/Time: 04/24/2016 9:34 AM Performed by: Allean Found Pre-anesthesia Checklist: Patient identified, Emergency Drugs available, Suction available, Patient being monitored and Timeout performed Patient Re-evaluated:Patient Re-evaluated prior to inductionOxygen Delivery Method: Nasal cannula Intubation Type: IV induction

## 2016-04-24 NOTE — H&P (Signed)
  All labs reviewed. Abnormal studies sent to patients PCP when indicated.  Previous H&P reviewed, patient examined, there are NO CHANGES.  Amanda Hill LOUIS9/5/20179:24 AM

## 2016-04-24 NOTE — Anesthesia Preprocedure Evaluation (Signed)
Anesthesia Evaluation  Patient identified by MRN, date of birth, ID band Patient awake    Reviewed: Allergy & Precautions, NPO status , Patient's Chart, lab work & pertinent test results  Airway Mallampati: III  TM Distance: >3 FB     Dental  (+) Chipped   Pulmonary neg pulmonary ROS,           Cardiovascular hypertension, Pt. on medications      Neuro/Psych negative neurological ROS  negative psych ROS   GI/Hepatic negative GI ROS, Neg liver ROS,   Endo/Other  diabetes, Well Controlled, Type 2Hypothyroidism   Renal/GU negative Renal ROS     Musculoskeletal  (+) Arthritis ,   Abdominal   Peds  Hematology negative hematology ROS (+)   Anesthesia Other Findings Obese.  Reproductive/Obstetrics                             Anesthesia Physical  Anesthesia Plan  ASA: III  Anesthesia Plan: MAC   Post-op Pain Management:    Induction: Intravenous  Airway Management Planned: Nasal Cannula  Additional Equipment:   Intra-op Plan:   Post-operative Plan:   Informed Consent: I have reviewed the patients History and Physical, chart, labs and discussed the procedure including the risks, benefits and alternatives for the proposed anesthesia with the patient or authorized representative who has indicated his/her understanding and acceptance.     Plan Discussed with: CRNA  Anesthesia Plan Comments:         Anesthesia Quick Evaluation

## 2016-04-25 ENCOUNTER — Encounter: Payer: Self-pay | Admitting: Ophthalmology

## 2016-04-26 DIAGNOSIS — Z13820 Encounter for screening for osteoporosis: Secondary | ICD-10-CM | POA: Diagnosis not present

## 2016-05-07 ENCOUNTER — Other Ambulatory Visit: Payer: Self-pay | Admitting: Family Medicine

## 2016-05-07 ENCOUNTER — Ambulatory Visit
Admission: RE | Admit: 2016-05-07 | Discharge: 2016-05-07 | Disposition: A | Discharge status: Home / Self Care | Payer: 59 | Source: Ambulatory Visit | Attending: Family Medicine | Admitting: Family Medicine

## 2016-05-07 DIAGNOSIS — Z1239 Encounter for other screening for malignant neoplasm of breast: Secondary | ICD-10-CM

## 2016-05-07 DIAGNOSIS — Z1231 Encounter for screening mammogram for malignant neoplasm of breast: Secondary | ICD-10-CM | POA: Diagnosis not present

## 2016-05-08 DIAGNOSIS — I10 Essential (primary) hypertension: Secondary | ICD-10-CM | POA: Diagnosis not present

## 2016-05-28 ENCOUNTER — Encounter: Payer: Self-pay | Admitting: Ophthalmology

## 2016-10-31 DIAGNOSIS — E119 Type 2 diabetes mellitus without complications: Secondary | ICD-10-CM | POA: Diagnosis not present

## 2016-10-31 DIAGNOSIS — E039 Hypothyroidism, unspecified: Secondary | ICD-10-CM | POA: Diagnosis not present

## 2016-10-31 DIAGNOSIS — I10 Essential (primary) hypertension: Secondary | ICD-10-CM | POA: Diagnosis not present

## 2016-10-31 DIAGNOSIS — E785 Hyperlipidemia, unspecified: Secondary | ICD-10-CM | POA: Diagnosis not present

## 2016-11-29 DIAGNOSIS — Z961 Presence of intraocular lens: Secondary | ICD-10-CM | POA: Diagnosis not present

## 2017-03-06 DIAGNOSIS — M79673 Pain in unspecified foot: Secondary | ICD-10-CM | POA: Insufficient documentation

## 2017-03-06 DIAGNOSIS — M79672 Pain in left foot: Secondary | ICD-10-CM | POA: Diagnosis not present

## 2017-04-01 ENCOUNTER — Other Ambulatory Visit: Payer: Self-pay | Admitting: Family Medicine

## 2017-04-01 DIAGNOSIS — Z1231 Encounter for screening mammogram for malignant neoplasm of breast: Secondary | ICD-10-CM

## 2017-05-08 ENCOUNTER — Ambulatory Visit
Admission: RE | Admit: 2017-05-08 | Discharge: 2017-05-08 | Disposition: A | Discharge status: Home / Self Care | Payer: 59 | Source: Ambulatory Visit | Attending: Family Medicine | Admitting: Family Medicine

## 2017-05-08 DIAGNOSIS — Z1231 Encounter for screening mammogram for malignant neoplasm of breast: Secondary | ICD-10-CM | POA: Insufficient documentation

## 2017-07-01 DIAGNOSIS — E119 Type 2 diabetes mellitus without complications: Secondary | ICD-10-CM | POA: Diagnosis not present

## 2017-07-01 DIAGNOSIS — E785 Hyperlipidemia, unspecified: Secondary | ICD-10-CM | POA: Diagnosis not present

## 2017-07-01 DIAGNOSIS — I10 Essential (primary) hypertension: Secondary | ICD-10-CM | POA: Diagnosis not present

## 2017-07-01 DIAGNOSIS — Z23 Encounter for immunization: Secondary | ICD-10-CM | POA: Diagnosis not present

## 2017-07-01 DIAGNOSIS — Z0000 Encounter for general adult medical examination without abnormal findings: Secondary | ICD-10-CM | POA: Diagnosis not present

## 2017-09-23 ENCOUNTER — Ambulatory Visit: Payer: Self-pay | Admitting: Nurse Practitioner

## 2017-09-23 VITALS — BP 112/67 | HR 95 | Temp 98.3°F | Wt 220.0 lb

## 2017-09-23 DIAGNOSIS — J0100 Acute maxillary sinusitis, unspecified: Secondary | ICD-10-CM

## 2017-09-23 MED ORDER — FLUTICASONE PROPIONATE 50 MCG/ACT NA SUSP: 2.0000 | Freq: Every day | NASAL | 0 refills | Status: AC

## 2017-09-23 MED ORDER — FLUCONAZOLE 150 MG PO TABS: 150.0000 mg | ORAL_TABLET | Freq: Once | ORAL | 0 refills | Status: AC

## 2017-09-23 MED ORDER — AMOXICILLIN-POT CLAVULANATE 875-125 MG PO TABS: 1.0000 | ORAL_TABLET | Freq: Two times a day (BID) | ORAL | 0 refills | Status: AC

## 2017-09-23 NOTE — Patient Instructions (Addendum)

## 2017-09-23 NOTE — Progress Notes (Signed)
Subjective:  Amanda Hill is a 62 y.o. female who presents for evaluation of possible sinusitis.  Symptoms include facial pain, headache described as pressure, nasal congestion, non productive cough, post nasal drip, sinus pressure, sinus pain, sneezing and sore throat.  Onset of symptoms was 2 weeks ago, and has been get better, then worsens since that time.  Treatment to date:  cough suppressants and ibuprofen.  High risk factors for influenza complications:  co-morbid illness.  Patient has a history of HTN, DM and Hyperlipidemia.   The following portions of the patient's history were reviewed and updated as appropriate:  allergies, current medications and past medical history.  Constitutional: positive for anorexia, fatigue and malaise, negative for night sweats, sweats and weight loss Eyes: negative Ears, nose, mouth, throat, and face: positive for nasal congestion, sore throat and post-nasal drip, negative for ear drainage, hearing loss and nasal congestion Respiratory: positive for cough and non-productive, negative for asthma, dyspnea on exertion, pneumonia, stridor and wheezing Cardiovascular: negative Neurological: positive for headaches, negative for coordination problems, dizziness, vertigo and weakness Allergic/Immunologic: positive for hay fever Objective:  BP 112/67   Pulse 95   Temp 98.3 F (36.8 C)   Wt 220 lb (99.8 kg)   SpO2 96%   BMI 38.97 kg/m  General appearance: alert, cooperative and no distress Head: Normocephalic, without obvious abnormality, atraumatic Eyes: conjunctivae/corneas clear. PERRL, EOM's intact. Fundi benign. Ears: abnormal TM right ear - mucoid middle ear fluid and abnormal TM left ear - mucoid middle ear fluid Nose: clear discharge, turbinates swollen, inflamed, moderate maxillary sinus tenderness bilateral, mild frontal sinus tenderness right Throat: lips, mucosa, and tongue normal; teeth and gums normal Lungs: clear to auscultation  bilaterally Heart: regular rate and rhythm, S1, S2 normal, no murmur, click, rub or gallop Abdomen: soft, non-tender; bowel sounds normal; no masses,  no organomegaly Pulses: 2+ and symmetric Skin: Skin color, texture, turgor normal. No rashes or lesions Lymph nodes: cervical and submandibular nodes normal Neurologic: Grossly normal    Assessment:  sinusitis    Plan:  Discussed diagnosis and treatment of sinusitis. Educational material distributed and questions answered. Suggested symptomatic OTC remedies. Supportive care with appropriate antipyretics and fluids. Augmentin per orders. Nasal steroids per orders. Follow up as needed.

## 2017-12-23 DIAGNOSIS — E119 Type 2 diabetes mellitus without complications: Secondary | ICD-10-CM | POA: Diagnosis not present

## 2017-12-23 DIAGNOSIS — I10 Essential (primary) hypertension: Secondary | ICD-10-CM | POA: Diagnosis not present

## 2017-12-23 DIAGNOSIS — E785 Hyperlipidemia, unspecified: Secondary | ICD-10-CM | POA: Diagnosis not present

## 2017-12-30 DIAGNOSIS — E039 Hypothyroidism, unspecified: Secondary | ICD-10-CM | POA: Diagnosis not present

## 2017-12-30 DIAGNOSIS — E119 Type 2 diabetes mellitus without complications: Secondary | ICD-10-CM | POA: Diagnosis not present

## 2017-12-30 DIAGNOSIS — I10 Essential (primary) hypertension: Secondary | ICD-10-CM | POA: Diagnosis not present

## 2017-12-30 DIAGNOSIS — E785 Hyperlipidemia, unspecified: Secondary | ICD-10-CM | POA: Diagnosis not present

## 2018-03-31 ENCOUNTER — Other Ambulatory Visit: Payer: Self-pay | Admitting: Family Medicine

## 2018-03-31 DIAGNOSIS — Z1231 Encounter for screening mammogram for malignant neoplasm of breast: Secondary | ICD-10-CM

## 2018-04-01 DIAGNOSIS — E119 Type 2 diabetes mellitus without complications: Secondary | ICD-10-CM | POA: Diagnosis not present

## 2018-05-09 ENCOUNTER — Ambulatory Visit
Admission: RE | Admit: 2018-05-09 | Discharge: 2018-05-09 | Disposition: A | Discharge status: Home / Self Care | Payer: 59 | Source: Ambulatory Visit | Attending: Family Medicine | Admitting: Family Medicine

## 2018-05-09 DIAGNOSIS — Z1231 Encounter for screening mammogram for malignant neoplasm of breast: Secondary | ICD-10-CM | POA: Diagnosis not present

## 2018-08-22 DIAGNOSIS — E785 Hyperlipidemia, unspecified: Secondary | ICD-10-CM | POA: Insufficient documentation

## 2018-09-05 ENCOUNTER — Encounter: Payer: Self-pay | Admitting: *Deleted

## 2018-09-05 MED ORDER — SODIUM CHLORIDE 0.9 % IV SOLN
INTRAVENOUS | Status: DC
  Administered 2018-09-08: 1000 mL via INTRAVENOUS

## 2018-09-06 ENCOUNTER — Encounter: Payer: Self-pay | Admitting: Anesthesiology

## 2018-09-08 ENCOUNTER — Ambulatory Visit: Payer: BLUE CROSS/BLUE SHIELD | Admitting: Anesthesiology

## 2018-09-08 ENCOUNTER — Ambulatory Visit
Admission: RE | Admit: 2018-09-08 | Discharge: 2018-09-08 | Disposition: A | Discharge status: Home / Self Care | Payer: BLUE CROSS/BLUE SHIELD | Attending: Unknown Physician Specialty | Admitting: Unknown Physician Specialty

## 2018-09-08 ENCOUNTER — Encounter
Admission: RE | Disposition: A | Discharge status: Home / Self Care | Payer: Self-pay | Source: Home / Self Care | Attending: Unknown Physician Specialty

## 2018-09-08 DIAGNOSIS — D128 Benign neoplasm of rectum: Secondary | ICD-10-CM | POA: Diagnosis not present

## 2018-09-08 DIAGNOSIS — E039 Hypothyroidism, unspecified: Secondary | ICD-10-CM | POA: Diagnosis not present

## 2018-09-08 DIAGNOSIS — R1310 Dysphagia, unspecified: Secondary | ICD-10-CM | POA: Insufficient documentation

## 2018-09-08 DIAGNOSIS — Z1211 Encounter for screening for malignant neoplasm of colon: Secondary | ICD-10-CM | POA: Diagnosis not present

## 2018-09-08 DIAGNOSIS — K2950 Unspecified chronic gastritis without bleeding: Secondary | ICD-10-CM | POA: Insufficient documentation

## 2018-09-08 DIAGNOSIS — K5730 Diverticulosis of large intestine without perforation or abscess without bleeding: Secondary | ICD-10-CM | POA: Insufficient documentation

## 2018-09-08 DIAGNOSIS — Z79890 Hormone replacement therapy (postmenopausal): Secondary | ICD-10-CM | POA: Insufficient documentation

## 2018-09-08 DIAGNOSIS — K640 First degree hemorrhoids: Secondary | ICD-10-CM | POA: Diagnosis not present

## 2018-09-08 DIAGNOSIS — K449 Diaphragmatic hernia without obstruction or gangrene: Secondary | ICD-10-CM | POA: Diagnosis not present

## 2018-09-08 DIAGNOSIS — Z86010 Personal history of colonic polyps: Secondary | ICD-10-CM | POA: Insufficient documentation

## 2018-09-08 DIAGNOSIS — Z79899 Other long term (current) drug therapy: Secondary | ICD-10-CM | POA: Diagnosis not present

## 2018-09-08 DIAGNOSIS — R12 Heartburn: Secondary | ICD-10-CM | POA: Insufficient documentation

## 2018-09-08 DIAGNOSIS — K644 Residual hemorrhoidal skin tags: Secondary | ICD-10-CM | POA: Insufficient documentation

## 2018-09-08 DIAGNOSIS — E119 Type 2 diabetes mellitus without complications: Secondary | ICD-10-CM | POA: Diagnosis not present

## 2018-09-08 DIAGNOSIS — I10 Essential (primary) hypertension: Secondary | ICD-10-CM | POA: Insufficient documentation

## 2018-09-08 DIAGNOSIS — Z7951 Long term (current) use of inhaled steroids: Secondary | ICD-10-CM | POA: Diagnosis not present

## 2018-09-08 DIAGNOSIS — K3189 Other diseases of stomach and duodenum: Secondary | ICD-10-CM | POA: Insufficient documentation

## 2018-09-08 DIAGNOSIS — Z7982 Long term (current) use of aspirin: Secondary | ICD-10-CM | POA: Insufficient documentation

## 2018-09-08 DIAGNOSIS — Z7984 Long term (current) use of oral hypoglycemic drugs: Secondary | ICD-10-CM | POA: Diagnosis not present

## 2018-09-08 DIAGNOSIS — E7800 Pure hypercholesterolemia, unspecified: Secondary | ICD-10-CM | POA: Diagnosis not present

## 2018-09-08 DIAGNOSIS — K621 Rectal polyp: Secondary | ICD-10-CM | POA: Diagnosis not present

## 2018-09-08 HISTORY — PX: COLONOSCOPY WITH PROPOFOL: SHX5780

## 2018-09-08 HISTORY — PX: ESOPHAGOGASTRODUODENOSCOPY (EGD) WITH PROPOFOL: SHX5813

## 2018-09-08 LAB — GLUCOSE, CAPILLARY: Glucose-Capillary: 129 mg/dL — ABNORMAL HIGH (ref 70–99)

## 2018-09-08 SURGERY — COLONOSCOPY WITH PROPOFOL
Anesthesia: General

## 2018-09-08 MED ORDER — PHENYLEPHRINE HCL 10 MG/ML IJ SOLN
INTRAMUSCULAR | Status: AC
  Filled 2018-09-08: qty 1

## 2018-09-08 MED ORDER — PROPOFOL 10 MG/ML IV BOLUS
INTRAVENOUS | Status: DC | PRN
  Administered 2018-09-08: 20 mg via INTRAVENOUS
  Administered 2018-09-08: 30 mg via INTRAVENOUS
  Administered 2018-09-08: 20 mg via INTRAVENOUS

## 2018-09-08 MED ORDER — MIDAZOLAM HCL 5 MG/5ML IJ SOLN
INTRAMUSCULAR | Status: DC | PRN
  Administered 2018-09-08: 2 mg via INTRAVENOUS

## 2018-09-08 MED ORDER — MIDAZOLAM HCL 2 MG/2ML IJ SOLN
INTRAMUSCULAR | Status: AC
  Filled 2018-09-08: qty 2

## 2018-09-08 MED ORDER — GLYCOPYRROLATE 0.2 MG/ML IJ SOLN
INTRAMUSCULAR | Status: AC
  Filled 2018-09-08: qty 1

## 2018-09-08 MED ORDER — PROPOFOL 500 MG/50ML IV EMUL
INTRAVENOUS | Status: AC
  Filled 2018-09-08: qty 50

## 2018-09-08 MED ORDER — EPHEDRINE SULFATE 50 MG/ML IJ SOLN
INTRAMUSCULAR | Status: AC
  Filled 2018-09-08: qty 1

## 2018-09-08 MED ORDER — FENTANYL CITRATE (PF) 100 MCG/2ML IJ SOLN
INTRAMUSCULAR | Status: DC | PRN
  Administered 2018-09-08 (×4): 25 ug via INTRAVENOUS

## 2018-09-08 MED ORDER — LIDOCAINE HCL (PF) 2 % IJ SOLN
INTRAMUSCULAR | Status: AC
  Filled 2018-09-08: qty 10

## 2018-09-08 MED ORDER — FENTANYL CITRATE (PF) 100 MCG/2ML IJ SOLN
INTRAMUSCULAR | Status: AC
  Filled 2018-09-08: qty 2

## 2018-09-08 MED ORDER — PROPOFOL 500 MG/50ML IV EMUL
INTRAVENOUS | Status: DC | PRN
  Administered 2018-09-08: 50 ug/kg/min via INTRAVENOUS

## 2018-09-08 MED ORDER — SODIUM CHLORIDE 0.9 % IV SOLN: INTRAVENOUS | Status: DC

## 2018-09-08 MED ORDER — GLYCOPYRROLATE 0.2 MG/ML IJ SOLN
INTRAMUSCULAR | Status: DC | PRN
  Administered 2018-09-08: 0.2 mg via INTRAVENOUS

## 2018-09-08 MED ORDER — LIDOCAINE HCL (PF) 2 % IJ SOLN
INTRAMUSCULAR | Status: DC | PRN
  Administered 2018-09-08: 100 mg

## 2018-09-08 NOTE — Transfer of Care (Signed)
Immediate Anesthesia Transfer of Care Note  Patient: Amanda Hill  Procedure(s) Performed: COLONOSCOPY WITH PROPOFOL (N/A ) ESOPHAGOGASTRODUODENOSCOPY (EGD) WITH PROPOFOL (N/A )  Patient Location: PACU  Anesthesia Type:General  Level of Consciousness: sedated  Airway & Oxygen Therapy: Patient Spontanous Breathing and Patient connected to nasal cannula oxygen  Post-op Assessment: Report given to RN and Post -op Vital signs reviewed and stable  Post vital signs: Reviewed and stable  Last Vitals:  Vitals Value Taken Time  BP    Temp    Pulse 100 09/08/2018  8:19 AM  Resp    SpO2 96 % 09/08/2018  8:19 AM  Vitals shown include unvalidated device data.  Last Pain:  Vitals:   09/08/18 0652  TempSrc: Tympanic  PainSc: 0-No pain         Complications: No apparent anesthesia complications

## 2018-09-08 NOTE — Op Note (Signed)
[facility] Gastroenterology Patient Name: Amanda Hill Procedure Date: 09/08/2018 7:21 AM MRN: [id number] Account #: [id number] Date of Birth: [birth year] Admit Type: Outpatient Age: 63 Room: [facility] ENDO ROOM 1 Gender: Female Note Status: Finalized Procedure:            Upper GI endoscopy Indications:          Dysphagia, Heartburn Providers:            Manya Silvas, MD Medicines:            Propofol per Anesthesia Complications:        No immediate complications. Procedure:            Pre-Anesthesia Assessment:                       - After reviewing the risks and benefits, the patient                        was deemed in satisfactory condition to undergo the                        procedure.                       After obtaining informed consent, the endoscope was                        passed under direct vision. Throughout the procedure,                        the patient's blood pressure, pulse, and oxygen                        saturations were monitored continuously. The Endoscope                        was introduced through the mouth, and advanced to the                        second part of duodenum. The upper GI endoscopy was                        accomplished without difficulty. The patient tolerated                        the procedure well. The upper GI endoscopy was                        accomplished without difficulty. The patient tolerated                        the procedure well. Findings:      The cricopharyngeal area was a little lopsided but no polyps or tumor or       mucosal abnormal.      The examined esophagus was normal.      A small hiatal hernia was present.      Striped mildly erythematous mucosa without bleeding was found in the       gastric body. Biopsies were taken with a cold forceps for histology.       Biopsies were taken with a cold forceps for Helicobacter pylori  testing.       Antrum negative.      The examined  duodenum was normal. Impression:           - Normal esophagus.                       - Small hiatal hernia.                       - Erythematous mucosa in the gastric body. Biopsied.                       - Normal examined duodenum. Recommendation:       - Await pathology results. Manya Silvas, MD 09/08/2018 7:59:50 AM This report has been signed electronically. Number of Addenda: 0 Note Initiated On: 09/08/2018 7:21 AM      [facility]

## 2018-09-08 NOTE — Anesthesia Post-op Follow-up Note (Signed)
Anesthesia QCDR form completed.        

## 2018-09-08 NOTE — Anesthesia Postprocedure Evaluation (Signed)
Anesthesia Post Note  Patient: Amanda Hill  Procedure(s) Performed: COLONOSCOPY WITH PROPOFOL (N/A ) ESOPHAGOGASTRODUODENOSCOPY (EGD) WITH PROPOFOL (N/A )  Patient location during evaluation: Endoscopy Anesthesia Type: General Level of consciousness: awake and alert Pain management: pain level controlled Vital Signs Assessment: post-procedure vital signs reviewed and stable Respiratory status: spontaneous breathing, nonlabored ventilation, respiratory function stable and patient connected to nasal cannula oxygen Cardiovascular status: blood pressure returned to baseline and stable Postop Assessment: no apparent nausea or vomiting Anesthetic complications: no     Last Vitals:  Vitals:   09/08/18 0840 09/08/18 0850  BP: 107/73 116/81  Pulse: 74 63  Resp: 18 17  Temp:    SpO2: 97% 98%    Last Pain:  Vitals:   09/08/18 0850  TempSrc:   PainSc: 0-No pain                 Lidwina Kaner S

## 2018-09-08 NOTE — Anesthesia Preprocedure Evaluation (Signed)
Anesthesia Evaluation  Patient identified by MRN, date of birth, ID band Patient awake    Reviewed: Allergy & Precautions, NPO status , Patient's Chart, lab work & pertinent test results, reviewed documented beta blocker date and time   Airway Mallampati: III  TM Distance: >3 FB     Dental  (+) Chipped   Pulmonary           Cardiovascular hypertension, Pt. on medications      Neuro/Psych    GI/Hepatic   Endo/Other  diabetes, Type 2Hypothyroidism   Renal/GU      Musculoskeletal  (+) Arthritis ,   Abdominal   Peds  Hematology   Anesthesia Other Findings   Reproductive/Obstetrics                             Anesthesia Physical Anesthesia Plan  ASA: II  Anesthesia Plan: General   Post-op Pain Management:    Induction: Intravenous  PONV Risk Score and Plan:   Airway Management Planned:   Additional Equipment:   Intra-op Plan:   Post-operative Plan:   Informed Consent: I have reviewed the patients History and Physical, chart, labs and discussed the procedure including the risks, benefits and alternatives for the proposed anesthesia with the patient or authorized representative who has indicated his/her understanding and acceptance.       Plan Discussed with: CRNA  Anesthesia Plan Comments:         Anesthesia Quick Evaluation

## 2018-09-08 NOTE — Op Note (Signed)
[facility] Gastroenterology Patient Name: Amanda Hill Procedure Date: 09/08/2018 7:21 AM MRN: [id number] Account #: [id number] Date of Birth: October 29, 1955 Admit Type: Outpatient Age: 63 Room: [facility] ENDO ROOM 1 Gender: Female Note Status: Finalized Procedure:            Colonoscopy Indications:          High risk colon cancer surveillance: Personal history                        of colonic polyps Providers:            Manya Silvas, MD Medicines:            Propofol per Anesthesia Complications:        No immediate complications. Procedure:            Pre-Anesthesia Assessment:                       - After reviewing the risks and benefits, the patient                        was deemed in satisfactory condition to undergo the                        procedure.                       After obtaining informed consent, the colonoscope was                        passed under direct vision. Throughout the procedure,                        the patient's blood pressure, pulse, and oxygen                        saturations were monitored continuously. The                        Colonoscope was introduced through the anus and                        advanced to the the cecum, identified by appendiceal                        orifice and ileocecal valve. The colonoscopy was                        performed without difficulty. The patient tolerated the                        procedure well. The quality of the bowel preparation                        was excellent. Findings:      A diminutive polyp was found in the rectum. The polyp was sessile. The       polyp was removed with a jumbo cold forceps. Resection and retrieval       were complete.      Multiple small and large-mouthed diverticula were found in the sigmoid       colon, descending colon and transverse  colon.      External and internal hemorrhoids were found during endoscopy. The       hemorrhoids were medium-sized,  external were large and Grade I (internal       hemorrhoids that do not prolapse).      The exam was otherwise without abnormality. Impression:           - One diminutive polyp in the rectum, removed with a                        jumbo cold forceps. Resected and retrieved.                       - Diverticulosis in the sigmoid colon, in the                        descending colon and in the transverse colon.                       - External and internal hemorrhoids.                       - The examination was otherwise normal. Recommendation:       - Await pathology results. Manya Silvas, MD 09/08/2018 8:23:34 AM This report has been signed electronically. Number of Addenda: 0 Note Initiated On: 09/08/2018 7:21 AM Scope Withdrawal Time: 0 hours 9 minutes 7 seconds  Total Procedure Duration: 0 hours 14 minutes 35 seconds       [employer]

## 2018-09-08 NOTE — H&P (Signed)
Primary Care Physician:  Juluis Pitch, MD Primary Gastroenterologist:  Dr. Vira Agar  Pre-Procedure History & Physical: HPI:  Amanda Hill is a 63 y.o. female is here for an endoscopy and colonoscopy.   Past Medical History:  Diagnosis Date  . Arthritis   . Diabetes mellitus without complication ([place])   . Hypercholesterolemia   . Hypertension   . Hypothyroidism     Past Surgical History:  Procedure Laterality Date  . ABDOMINAL HYSTERECTOMY    . APPENDECTOMY    . BREAST EXCISIONAL BIOPSY Right [id number]   neg  . CATARACT EXTRACTION W/PHACO Left 03/29/2016   Procedure: CATARACT EXTRACTION PHACO AND INTRAOCULAR LENS PLACEMENT (IOC);  Surgeon: Birder Robson, MD;  Location: [facility] ORS;  Service: Ophthalmology;  Laterality: Left;  [place] 00:43 AP% 18.7 CDE 8.18 Fluid pack lot # [id number]  . CATARACT EXTRACTION W/PHACO Right 04/24/2016   Procedure: CATARACT EXTRACTION PHACO AND INTRAOCULAR LENS PLACEMENT (IOC);  Surgeon: Birder Robson, MD;  Location: [facility] ORS;  Service: Ophthalmology;  Laterality: Right;  [place]    00:49.4 AP%     14.1 CDE      6.96 Fluid pack lot # [id number]  . CERVICAL SPINE SURGERY    . CESAREAN SECTION    . CHOLECYSTECTOMY    . COLONOSCOPY    . DILATION AND CURETTAGE OF UTERUS      Prior to Admission medications   Medication Sig Start Date End Date Taking? Authorizing Provider  aspirin EC 81 MG tablet Take 81 mg by mouth daily.   Yes [provider]  CALCIUM PO Take 1 tablet by mouth daily.   Yes [provider]  estradiol (ESTRACE) 0.5 MG tablet Take 0.5 mg by mouth daily.   Yes [provider]  levothyroxine (SYNTHROID, LEVOTHROID) 75 MCG tablet Take 75 mcg by mouth daily before breakfast.   Yes [provider]  linagliptin (TRADJENTA) 5 MG TABS tablet Take 5 mg by mouth daily.   Yes [provider]  lisinopril (PRINIVIL,ZESTRIL) 10 MG tablet Take 10 mg by mouth daily.   Yes [provider]   lisinopril-hydrochlorothiazide (PRINZIDE,ZESTORETIC) 10-12.5 MG tablet Take 1 tablet by mouth daily.   Yes [provider]  metFORMIN (GLUCOPHAGE) 1000 MG tablet Take 1,000 mg by mouth 2 (two) times daily.   Yes [provider]  Nutritional Supplements (ESTROVEN PO) Take 1 tablet by mouth daily.   Yes [provider]  simvastatin (ZOCOR) 10 MG tablet Take 10 mg by mouth daily.   Yes [provider]  fluticasone (FLONASE) 50 MCG/ACT nasal spray Place 2 sprays into both nostrils daily for 10 days. 09/23/17 10/03/17  Kara Dies, NP    Allergies as of 07/08/2018 - Review Complete 09/23/2017  Allergen Reaction Noted  . Dapsone Hives 07/26/2015    Family History  Problem Relation Age of Onset  . Breast cancer Maternal Grandmother 60  . Breast cancer Maternal Aunt     Social History   Socioeconomic History  . Marital status: Married    Spouse name: Not on file  . Number of children: Not on file  . Years of education: Not on file  . Highest education level: Not on file  Occupational History  . Not on file  Social Needs  . Financial resource strain: Not on file  . Food insecurity:    Worry: Not on file    Inability: Not on file  . Transportation needs:    Medical: Not on file  Non-medical: Not on file  Tobacco Use  . Smoking status: Never Smoker  . Smokeless tobacco: Never Used  Substance and Sexual Activity  . Alcohol use: No  . Drug use: No  . Sexual activity: Not on file  Lifestyle  . Physical activity:    Days per week: Not on file    Minutes per session: Not on file  . Stress: Not on file  Relationships  . Social connections:    Talks on phone: Not on file    Gets together: Not on file    Attends religious service: Not on file    Active member of club or organization: Not on file    Attends meetings of clubs or organizations: Not on file    Relationship status: Not on file  . Intimate partner violence:    Fear of  current or ex partner: Not on file    Emotionally abused: Not on file    Physically abused: Not on file    Forced sexual activity: Not on file  Other Topics Concern  . Not on file  Social History Narrative  . Not on file    Review of Systems: See HPI, otherwise negative ROS  Physical Exam: BP 137/67   Pulse 77   Temp (!) 96.7 F (35.9 C) (Tympanic)   Resp 20   Ht 5\' 3"  (1.6 m)   Wt 99.3 kg   SpO2 100%   BMI 38.79 kg/m  General:   Alert,  pleasant and cooperative in NAD Head:  Normocephalic and atraumatic. Neck:  Supple; no masses or thyromegaly. Lungs:  Clear throughout to auscultation.    Heart:  Regular rate and rhythm. Abdomen:  Soft, nontender and nondistended. Normal bowel sounds, without guarding, and without rebound.   Neurologic:  Alert and  oriented x4;  grossly normal neurologically.  Impression/Plan: [name] is here for an endoscopy and colonoscopy to be performed for [facility] colon polyps and dysphagia.  Last colonoscopy was 07/06/2013. No polyps found at that time.  Risks, benefits, limitations, and alternatives regarding  endoscopy and colonoscopy have been reviewed with the patient.  Questions have been answered.  All parties agreeable.   Gaylyn Cheers, MD  09/08/2018, 7:40 AM

## 2018-09-09 ENCOUNTER — Encounter: Payer: Self-pay | Admitting: Unknown Physician Specialty

## 2018-09-09 LAB — SURGICAL PATHOLOGY

## 2019-04-03 ENCOUNTER — Other Ambulatory Visit: Payer: Self-pay | Admitting: Family Medicine

## 2019-04-03 DIAGNOSIS — Z1231 Encounter for screening mammogram for malignant neoplasm of breast: Secondary | ICD-10-CM

## 2019-05-05 ENCOUNTER — Other Ambulatory Visit: Payer: Self-pay

## 2019-05-05 DIAGNOSIS — Z20822 Contact with and (suspected) exposure to covid-19: Secondary | ICD-10-CM

## 2019-05-07 LAB — NOVEL CORONAVIRUS, NAA: SARS-CoV-2, NAA: NOT DETECTED

## 2019-05-15 ENCOUNTER — Ambulatory Visit
Admission: RE | Admit: 2019-05-15 | Discharge: 2019-05-15 | Disposition: A | Discharge status: Home / Self Care | Payer: BC Managed Care – PPO | Source: Ambulatory Visit | Attending: Family Medicine | Admitting: Family Medicine

## 2019-05-15 DIAGNOSIS — Z1231 Encounter for screening mammogram for malignant neoplasm of breast: Secondary | ICD-10-CM | POA: Diagnosis not present

## 2019-08-06 IMAGING — MG MM DIGITAL SCREENING BILAT W/ TOMO W/ CAD
8 of 14 series · 8 of 40 positions shown · non-contrast
Comparison: Previous exam(s).

CLINICAL DATA: Screening.

EXAM:
DIGITAL SCREENING BILATERAL MAMMOGRAM WITH TOMO AND CAD

[R CC synth-2D (1 of 2)]
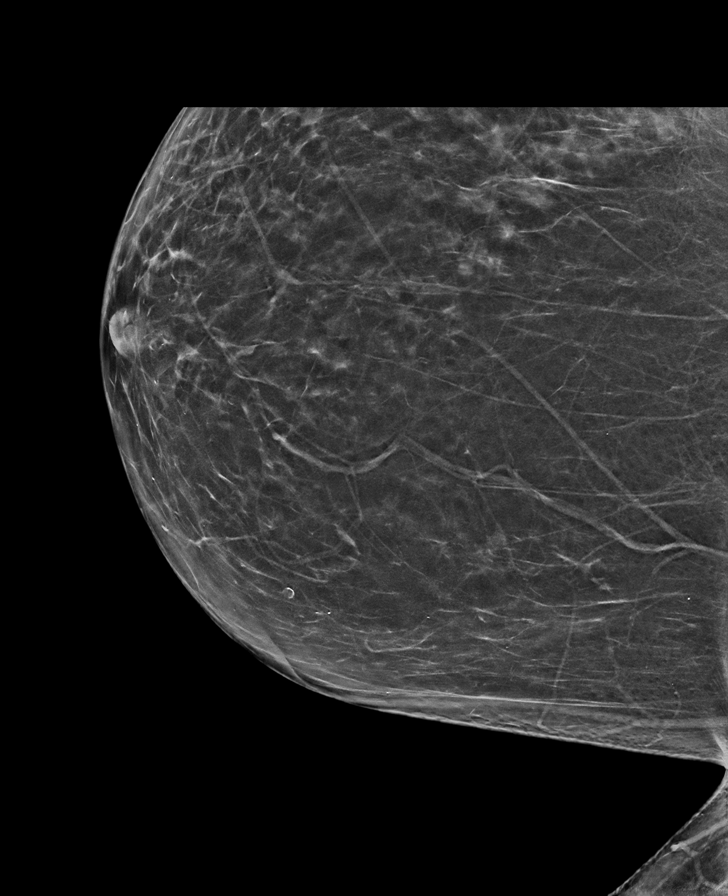

[L MLO synth-2D]
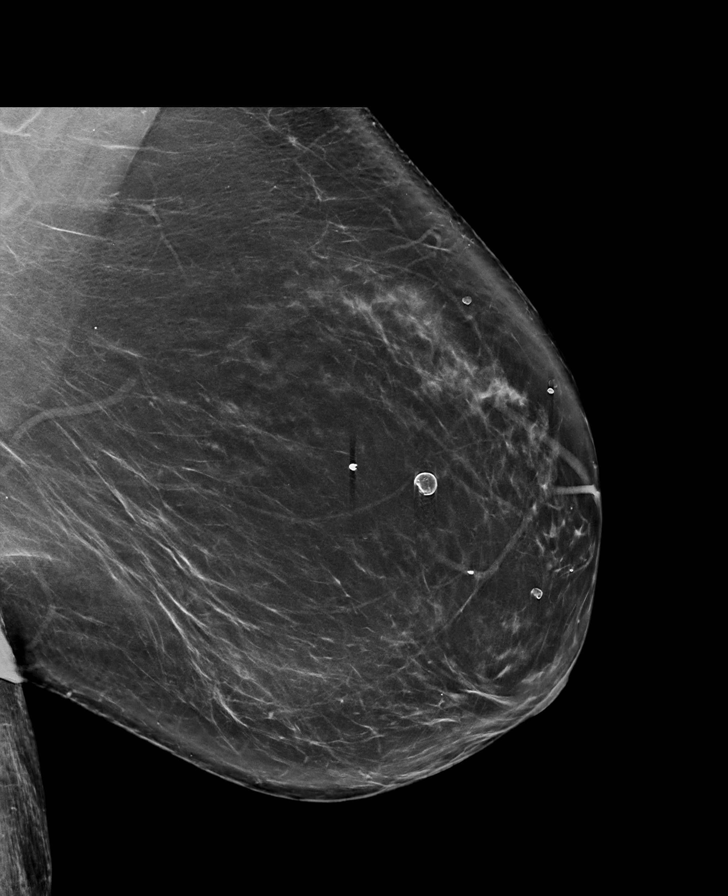

[L CC synth-2D (1 of 2)]
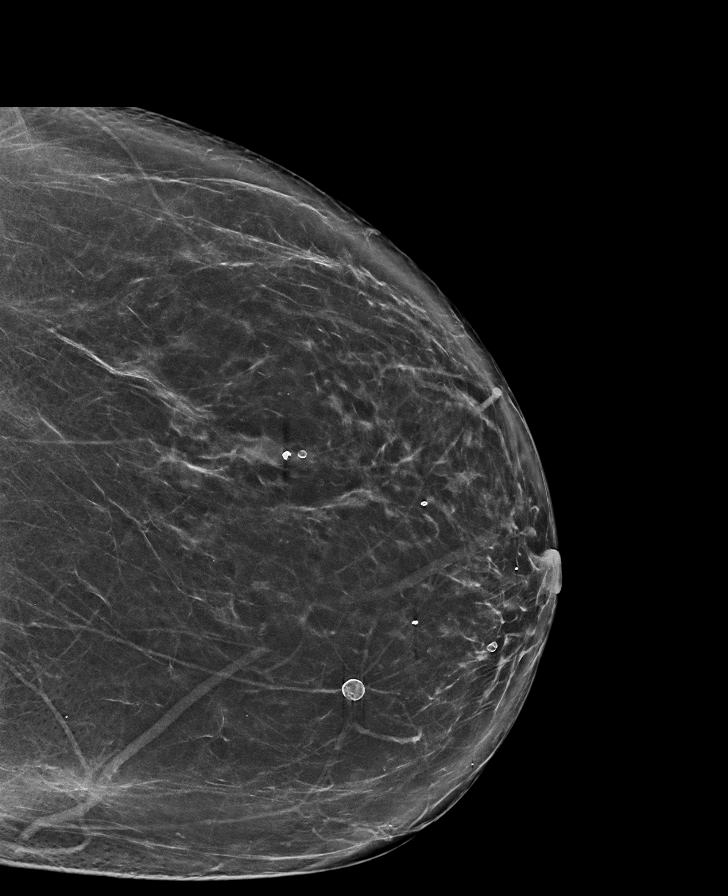

[R MLO synth-2D (1 of 2)]
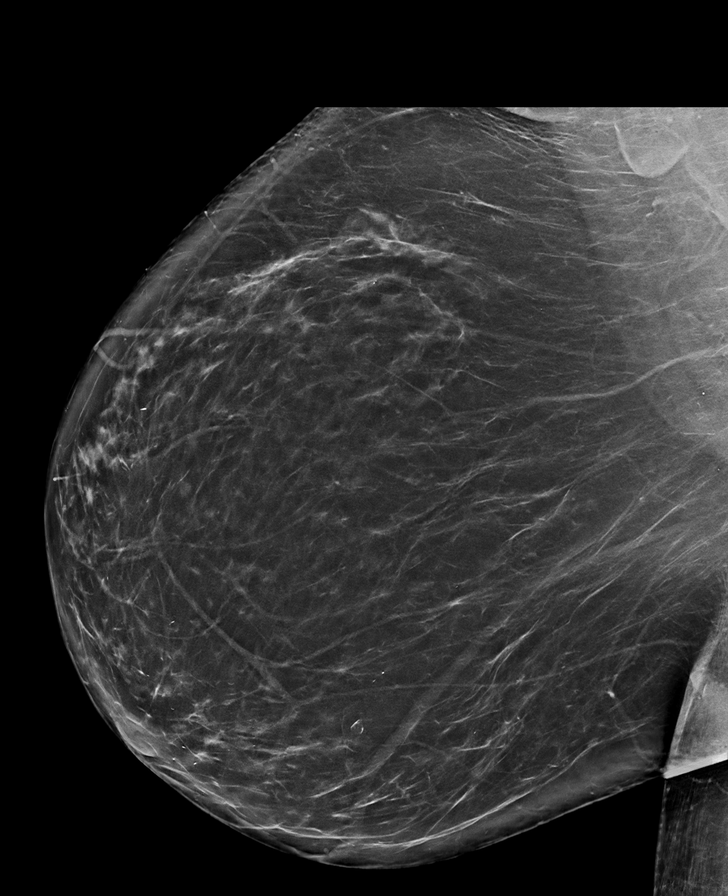

[R CC synth-2D (2 of 2)]
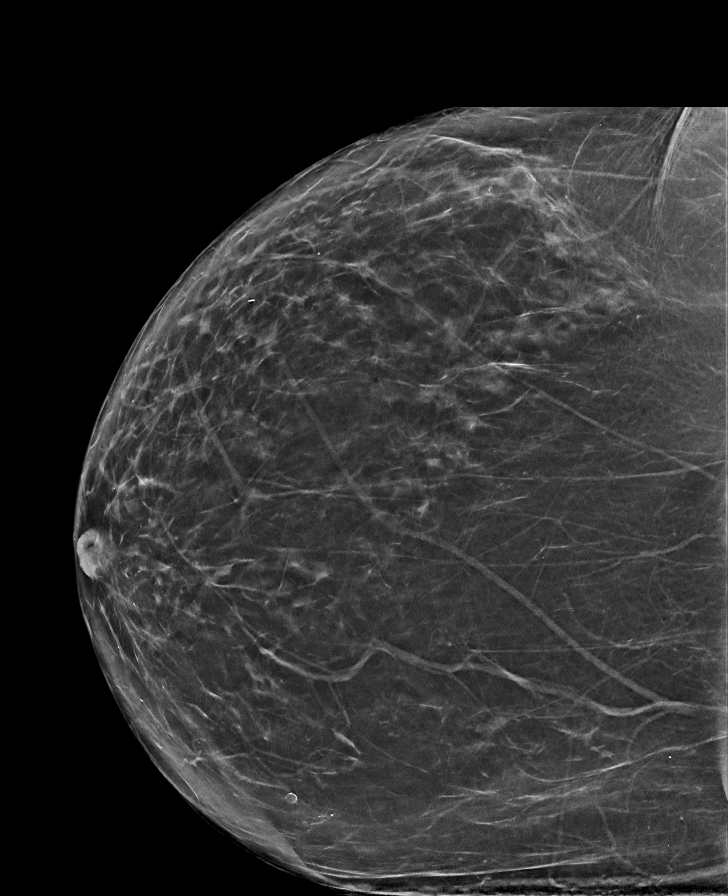

[R MLO synth-2D (2 of 2)]
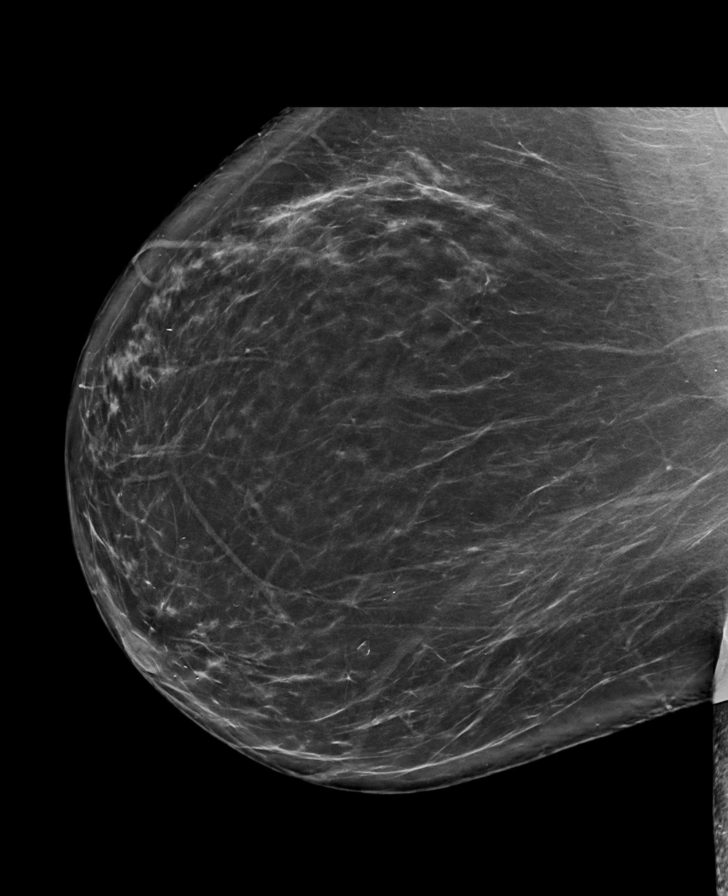

[L CC synth-2D (2 of 2)]
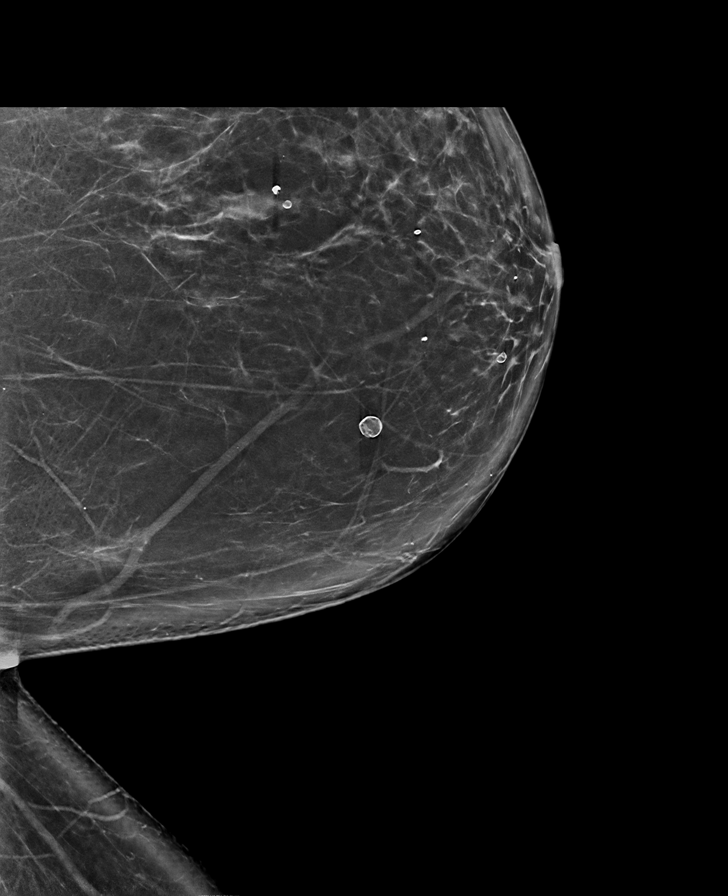

[R MLO tomo · tomo slice 47/94.0]
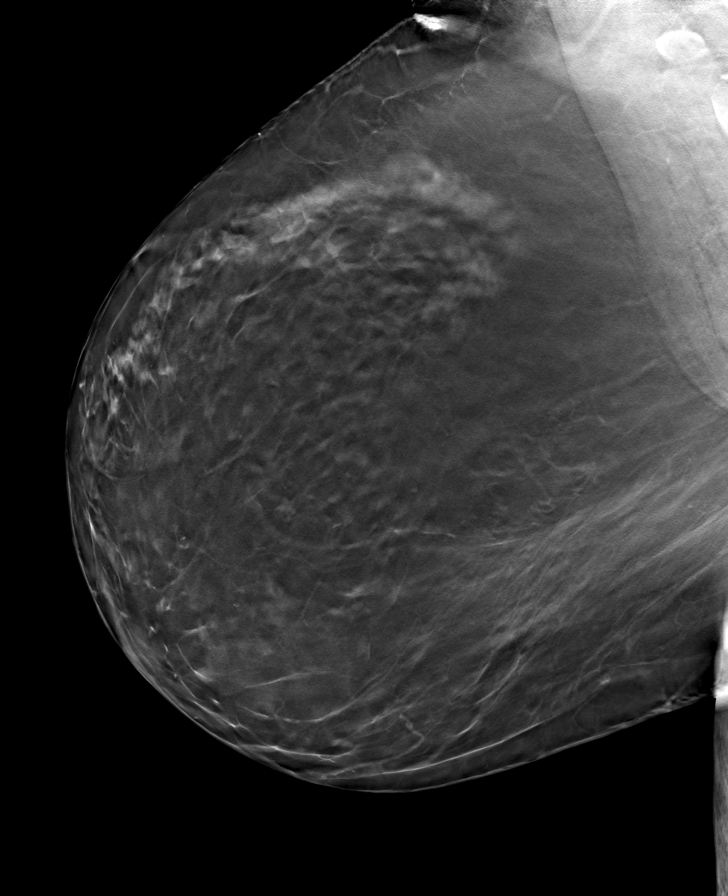

[8 of 40 positions shown; findings below may reference images not displayed]

ACR Breast Density Category b: There are scattered areas of
fibroglandular density.
FINDINGS: There are no findings suspicious for malignancy. Images were
processed with CAD.
IMPRESSION: No mammographic evidence of malignancy. A result letter of this
screening mammogram will be mailed directly to the patient.

RECOMMENDATION:
Screening mammogram in one year. (Code:CN-U-775)

BI-RADS CATEGORY  1: Negative.

## 2019-09-21 ENCOUNTER — Other Ambulatory Visit: Payer: Self-pay | Admitting: Family Medicine

## 2019-09-22 ENCOUNTER — Other Ambulatory Visit: Payer: Self-pay | Admitting: Family Medicine

## 2019-09-22 DIAGNOSIS — R1031 Right lower quadrant pain: Secondary | ICD-10-CM

## 2019-09-24 ENCOUNTER — Other Ambulatory Visit: Payer: Self-pay

## 2019-09-24 ENCOUNTER — Ambulatory Visit
Admission: RE | Admit: 2019-09-24 | Discharge: 2019-09-24 | Disposition: A | Discharge status: Home / Self Care | Payer: BC Managed Care – PPO | Source: Ambulatory Visit | Attending: Family Medicine | Admitting: Family Medicine

## 2019-09-24 DIAGNOSIS — R1031 Right lower quadrant pain: Secondary | ICD-10-CM | POA: Diagnosis present

## 2020-03-31 ENCOUNTER — Other Ambulatory Visit: Payer: Self-pay | Admitting: Family Medicine

## 2020-03-31 DIAGNOSIS — Z1231 Encounter for screening mammogram for malignant neoplasm of breast: Secondary | ICD-10-CM

## 2020-06-29 ENCOUNTER — Other Ambulatory Visit: Payer: Self-pay

## 2020-06-29 ENCOUNTER — Ambulatory Visit
Admission: RE | Admit: 2020-06-29 | Discharge: 2020-06-29 | Disposition: A | Discharge status: Home / Self Care | Payer: BC Managed Care – PPO | Source: Ambulatory Visit | Attending: Family Medicine | Admitting: Family Medicine

## 2020-06-29 DIAGNOSIS — Z1231 Encounter for screening mammogram for malignant neoplasm of breast: Secondary | ICD-10-CM | POA: Insufficient documentation

## 2021-05-16 ENCOUNTER — Other Ambulatory Visit: Payer: Self-pay | Admitting: Family Medicine

## 2021-05-16 DIAGNOSIS — Z1231 Encounter for screening mammogram for malignant neoplasm of breast: Secondary | ICD-10-CM

## 2021-07-06 ENCOUNTER — Other Ambulatory Visit: Payer: Self-pay

## 2021-07-06 ENCOUNTER — Ambulatory Visit
Admission: RE | Admit: 2021-07-06 | Discharge: 2021-07-06 | Disposition: A | Discharge status: Home / Self Care | Payer: Medicare Other | Source: Ambulatory Visit | Attending: Family Medicine | Admitting: Family Medicine

## 2021-07-06 DIAGNOSIS — Z1231 Encounter for screening mammogram for malignant neoplasm of breast: Secondary | ICD-10-CM | POA: Insufficient documentation

## 2022-05-23 ENCOUNTER — Other Ambulatory Visit: Payer: Self-pay | Admitting: Family Medicine

## 2022-05-23 DIAGNOSIS — Z1231 Encounter for screening mammogram for malignant neoplasm of breast: Secondary | ICD-10-CM

## 2022-07-09 ENCOUNTER — Ambulatory Visit
Admission: RE | Admit: 2022-07-09 | Discharge: 2022-07-09 | Disposition: A | Discharge status: Home / Self Care | Payer: Medicare Other | Source: Ambulatory Visit | Attending: Family Medicine | Admitting: Family Medicine

## 2022-07-09 DIAGNOSIS — Z1231 Encounter for screening mammogram for malignant neoplasm of breast: Secondary | ICD-10-CM | POA: Insufficient documentation

## 2023-01-09 ENCOUNTER — Other Ambulatory Visit: Payer: Self-pay | Admitting: Podiatry

## 2023-01-09 ENCOUNTER — Ambulatory Visit (INDEPENDENT_AMBULATORY_CARE_PROVIDER_SITE_OTHER): Payer: Medicare Other | Admitting: Podiatry

## 2023-01-09 ENCOUNTER — Encounter: Payer: Self-pay | Admitting: Podiatry

## 2023-01-09 ENCOUNTER — Ambulatory Visit (INDEPENDENT_AMBULATORY_CARE_PROVIDER_SITE_OTHER): Payer: Medicare Other

## 2023-01-09 DIAGNOSIS — M778 Other enthesopathies, not elsewhere classified: Secondary | ICD-10-CM

## 2023-01-09 DIAGNOSIS — M109 Gout, unspecified: Secondary | ICD-10-CM

## 2023-01-09 DIAGNOSIS — M542 Cervicalgia: Secondary | ICD-10-CM | POA: Insufficient documentation

## 2023-01-09 DIAGNOSIS — M7540 Impingement syndrome of unspecified shoulder: Secondary | ICD-10-CM | POA: Insufficient documentation

## 2023-01-09 DIAGNOSIS — M5412 Radiculopathy, cervical region: Secondary | ICD-10-CM | POA: Insufficient documentation

## 2023-01-09 MED ORDER — ALLOPURINOL 100 MG PO TABS: 100.0000 mg | ORAL_TABLET | Freq: Two times a day (BID) | ORAL | 6 refills | Status: DC

## 2023-01-09 NOTE — Progress Notes (Signed)
Subjective:  Patient ID: Amanda Hill, female    DOB: [birth year],  MRN: [id number] HPI Chief Complaint  Patient presents with   Toe Pain    Hallux right - red, swollen x few weeks, continues to have these episodes, seen PCP-Rx'd colchicine which really hurts her stomach, also has been on intermittent prednisone, changed her diet   New Patient (Initial Visit)    67 y.o. female presents with the above complaint.   ROS: Denies fever chills nausea vomit muscle aches pains calf pain back pain chest pain shortness of breath.  Past Medical History:  Diagnosis Date   Arthritis    Diabetes mellitus without complication (HCC)    Hypercholesterolemia    Hypertension    Hypothyroidism    Past Surgical History:  Procedure Laterality Date   ABDOMINAL HYSTERECTOMY     APPENDECTOMY     BREAST EXCISIONAL BIOPSY Right [id number]   neg   CATARACT EXTRACTION W/PHACO Left 03/29/2016   Procedure: CATARACT EXTRACTION PHACO AND INTRAOCULAR LENS PLACEMENT (IOC);  Surgeon: Galen Manila, MD;  Location: [facility] ORS;  Service: Ophthalmology;  Laterality: Left;  [place] 00:43 AP% 18.7 CDE 8.18 Fluid pack lot # [id number]   CATARACT EXTRACTION W/PHACO Right 04/24/2016   Procedure: CATARACT EXTRACTION PHACO AND INTRAOCULAR LENS PLACEMENT (IOC);  Surgeon: Galen Manila, MD;  Location: [facility] ORS;  Service: Ophthalmology;  Laterality: Right;  [place]    00:49.4 AP%     14.1 CDE      6.96 Fluid pack lot # [id number]   CERVICAL SPINE SURGERY     CESAREAN SECTION     CHOLECYSTECTOMY     COLONOSCOPY     COLONOSCOPY WITH PROPOFOL N/A 09/08/2018   Procedure: COLONOSCOPY WITH PROPOFOL;  Surgeon: Scot Jun, MD;  Location: [facility] ENDOSCOPY;  Service: Endoscopy;  Laterality: N/A;   DILATION AND CURETTAGE OF UTERUS     ESOPHAGOGASTRODUODENOSCOPY (EGD) WITH PROPOFOL N/A 09/08/2018   Procedure: ESOPHAGOGASTRODUODENOSCOPY (EGD) WITH PROPOFOL;  Surgeon: Scot Jun, MD;  Location: [facility] ENDOSCOPY;  Service: Endoscopy;   Laterality: N/A;    Current Outpatient Medications:    allopurinol (ZYLOPRIM) 100 MG tablet, Take 1 tablet (100 mg total) by mouth 2 (two) times daily., Disp: 60 tablet, Rfl: 6   colchicine 0.6 MG tablet, Take by mouth., Disp: , Rfl:    aspirin EC 81 MG tablet, Take 81 mg by mouth daily., Disp: , Rfl:    CALCIUM PO, Take 1 tablet by mouth daily., Disp: , Rfl:    fluticasone (FLONASE) 50 MCG/ACT nasal spray, Place 2 sprays into both nostrils daily for 10 days., Disp: 16 g, Rfl: 0   levothyroxine (SYNTHROID, LEVOTHROID) 75 MCG tablet, Take 75 mcg by mouth daily before breakfast., Disp: , Rfl:    lisinopril (PRINIVIL,ZESTRIL) 10 MG tablet, Take 10 mg by mouth daily., Disp: , Rfl:    metFORMIN (GLUCOPHAGE) 1000 MG tablet, Take 1,000 mg by mouth 2 (two) times daily., Disp: , Rfl:    Nutritional Supplements (ESTROVEN PO), Take 1 tablet by mouth daily., Disp: , Rfl:    simvastatin (ZOCOR) 10 MG tablet, Take 10 mg by mouth daily., Disp: , Rfl:   Allergies  Allergen Reactions   Daptomycin Hives   Dapsone Hives   Review of Systems Objective:  There were no vitals filed for this visit.  General: Well developed, nourished, in no acute distress, alert and oriented x3  I have reviewed the labs which did demonstrate elevated uric acid levels also demonstrates mild kidney failure.  Reviewing her meds she has discontinued hydrochlorothiazide but continues colchicine anywhere from 2-4 times a day.  Dermatological: Skin is warm, dry and supple bilateral. Nails x 10 are well maintained; remaining integument appears unremarkable at this time. There are no open sores, no preulcerative lesions, no rash or signs of infection present.  Vascular: Dorsalis Pedis artery and Posterior Tibial artery pedal pulses are 2/4 bilateral with immedate capillary fill time. Pedal hair growth present. No varicosities and no lower extremity edema present bilateral.   Neruologic: Grossly intact via light touch bilateral.  Vibratory intact via tuning fork bilateral. Protective threshold with Semmes Wienstein monofilament intact to all pedal sites bilateral. Patellar and Achilles deep tendon reflexes 2+ bilateral. No Babinski or clonus noted bilateral.   Musculoskeletal: No gross boney pedal deformities bilateral. No pain, crepitus, or limitation noted with foot and ankle range of motion bilateral. Muscular strength 5/5 in all groups tested bilateral.  Red swollen interphalangeal joint medial aspect right hallux is warm to the touch and tender.  Gait: Unassisted, Nonantalgic.    Radiographs:  Radiographs demonstrate an osseously mature individual no significant osseous abnormalities in the area of question around the IP joint of the hallux.  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  Plan: Discussed etiology pathology conservative versus surgical therapies at this point I feel that starting her on allopurinol 100 mg twice daily would be adequate.  I would like to follow-up with her in 1 month at which time we will reevaluate her BUN and creatinine to see if this has improved at all we will also evaluate her uric acid level.  She will call with questions or concerns.     Benford Asch T. [place], [place]

## 2023-02-13 ENCOUNTER — Ambulatory Visit (INDEPENDENT_AMBULATORY_CARE_PROVIDER_SITE_OTHER): Payer: Medicare Other | Admitting: Podiatry

## 2023-02-13 DIAGNOSIS — M109 Gout, unspecified: Secondary | ICD-10-CM | POA: Diagnosis not present

## 2023-02-13 MED ORDER — METHYLPREDNISOLONE 4 MG PO TBPK: ORAL_TABLET | ORAL | 0 refills | Status: AC

## 2023-02-13 MED ORDER — COLCHICINE 0.6 MG PO TABS: 0.6000 mg | ORAL_TABLET | Freq: Every day | ORAL | 3 refills | Status: AC

## 2023-02-13 NOTE — Progress Notes (Signed)
She presents today for follow-up of her gout states that it feels exactly like it did before.  She points to the hallux interphalangeal joint of the right foot where it is still red and warm.  She denies fever chills nausea vomit muscle aches and pains states that she continues her allopurinol on it twice daily basis.  Last blood work demonstrated that her BUN/creatinine was improved and that her uric acid was down to a 5.0.  Objective: Reviewed labs.  Toe is still red and hot at the hallux interphalangeal joint.  Pulses remain palpable.  No other pain on palpation of the foot.  Assessment probable gout.  Plan: At this point since her uric acid level is down unguinal start her on oral anti-inflammatory.  Methylprednisolone was sent in should this not alleviate her symptoms she will notify us immediately.  Otherwise should this worsen we will obtain more blood work.

## 2023-03-13 ENCOUNTER — Ambulatory Visit: Payer: Medicare Other | Admitting: Podiatry

## 2023-04-10 ENCOUNTER — Other Ambulatory Visit: Payer: Self-pay | Admitting: Podiatry

## 2023-05-28 ENCOUNTER — Other Ambulatory Visit: Payer: Self-pay | Admitting: Family Medicine

## 2023-05-28 DIAGNOSIS — Z1231 Encounter for screening mammogram for malignant neoplasm of breast: Secondary | ICD-10-CM

## 2023-07-11 ENCOUNTER — Ambulatory Visit
Admission: RE | Admit: 2023-07-11 | Discharge: 2023-07-11 | Disposition: A | Discharge status: Home / Self Care | Payer: Medicare Other | Source: Ambulatory Visit | Attending: Family Medicine | Admitting: Family Medicine

## 2023-07-11 DIAGNOSIS — Z1231 Encounter for screening mammogram for malignant neoplasm of breast: Secondary | ICD-10-CM | POA: Diagnosis present

## 2024-01-23 ENCOUNTER — Encounter: Payer: Self-pay | Admitting: Internal Medicine

## 2024-02-11 ENCOUNTER — Encounter
Admission: RE | Disposition: A | Discharge status: Home / Self Care | Payer: Self-pay | Source: Home / Self Care | Attending: Internal Medicine

## 2024-02-11 ENCOUNTER — Other Ambulatory Visit: Payer: Self-pay

## 2024-02-11 ENCOUNTER — Ambulatory Visit: Admitting: Certified Registered"

## 2024-02-11 ENCOUNTER — Ambulatory Visit
Admission: RE | Admit: 2024-02-11 | Discharge: 2024-02-11 | Disposition: A | Discharge status: Home / Self Care | Attending: Internal Medicine | Admitting: Internal Medicine

## 2024-02-11 DIAGNOSIS — Z7984 Long term (current) use of oral hypoglycemic drugs: Secondary | ICD-10-CM | POA: Insufficient documentation

## 2024-02-11 DIAGNOSIS — K5730 Diverticulosis of large intestine without perforation or abscess without bleeding: Secondary | ICD-10-CM | POA: Diagnosis not present

## 2024-02-11 DIAGNOSIS — E039 Hypothyroidism, unspecified: Secondary | ICD-10-CM | POA: Diagnosis not present

## 2024-02-11 DIAGNOSIS — Z1211 Encounter for screening for malignant neoplasm of colon: Secondary | ICD-10-CM | POA: Diagnosis present

## 2024-02-11 DIAGNOSIS — E119 Type 2 diabetes mellitus without complications: Secondary | ICD-10-CM | POA: Diagnosis not present

## 2024-02-11 DIAGNOSIS — I10 Essential (primary) hypertension: Secondary | ICD-10-CM | POA: Diagnosis not present

## 2024-02-11 DIAGNOSIS — D120 Benign neoplasm of cecum: Secondary | ICD-10-CM | POA: Diagnosis not present

## 2024-02-11 DIAGNOSIS — K643 Fourth degree hemorrhoids: Secondary | ICD-10-CM | POA: Insufficient documentation

## 2024-02-11 HISTORY — PX: HEMOSTASIS CLIP PLACEMENT: SHX6857

## 2024-02-11 HISTORY — DX: Impingement syndrome of unspecified shoulder: M75.40

## 2024-02-11 HISTORY — DX: Radiculopathy, cervical region: M54.12

## 2024-02-11 HISTORY — DX: Hyperlipidemia, unspecified: E78.5

## 2024-02-11 HISTORY — PX: COLONOSCOPY: SHX5424

## 2024-02-11 HISTORY — PX: POLYPECTOMY: SHX149

## 2024-02-11 LAB — GLUCOSE, CAPILLARY: Glucose-Capillary: 168 mg/dL — ABNORMAL HIGH (ref 70–99)

## 2024-02-11 SURGERY — COLONOSCOPY
Anesthesia: General

## 2024-02-11 MED ORDER — PROPOFOL 500 MG/50ML IV EMUL
INTRAVENOUS | Status: DC | PRN
  Administered 2024-02-11: 100 ug/kg/min via INTRAVENOUS

## 2024-02-11 MED ORDER — SODIUM CHLORIDE 0.9 % IV SOLN
INTRAVENOUS | Status: DC
Start: 2024-02-11 — End: 2024-02-11

## 2024-02-11 MED ORDER — LIDOCAINE HCL (PF) 2 % IJ SOLN
INTRAMUSCULAR | Status: DC | PRN
  Administered 2024-02-11: 60 mg via INTRADERMAL

## 2024-02-11 MED ORDER — PROPOFOL 10 MG/ML IV BOLUS
INTRAVENOUS | Status: DC | PRN
  Administered 2024-02-11: 80 mg via INTRAVENOUS

## 2024-02-11 MED ORDER — LIDOCAINE HCL (CARDIAC) PF 100 MG/5ML IV SOSY
PREFILLED_SYRINGE | INTRAVENOUS | Status: DC | PRN
  Administered 2024-02-11: 60 mg via INTRAVENOUS

## 2024-02-11 NOTE — H&P (Signed)
 Outpatient short stay form Pre-procedure 02/11/2024 10:04 AM Macon Sandiford K. Aundria, M.D.  Primary Physician: Alm Na, M.D.  Reason for visit:  Personal history of colon polyps  History of present illness:                            Patient presents for colonoscopy for a personal hx of colon polyps. The patient denies abdominal pain, abnormal weight loss or rectal bleeding.  Last colonoscopy - DR Viktoria - 2020 - Hyperplastic polyps.    Current Facility-Administered Medications:    0.9 %  sodium chloride  infusion, , Intravenous, Continuous, [place], Djibril Glogowski K, MD, Last Rate: 20 mL/hr at 02/11/24 9077, New Bag at 02/11/24 9077  Medications Prior to Admission  Medication Sig Dispense Refill Last Dose/Taking   allopurinol  (ZYLOPRIM ) 100 MG tablet TAKE 1 TABLET BY MOUTH TWICE A DAY 180 tablet 2 02/10/2024   aspirin EC 81 MG tablet Take 81 mg by mouth daily.   Past Week   CALCIUM PO Take 1 tablet by mouth daily.   Past Week   colchicine  0.6 MG tablet Take by mouth.   Past Month   colchicine  0.6 MG tablet Take 1 tablet (0.6 mg total) by mouth daily. 90 tablet 3 Past Month   levothyroxine (SYNTHROID, LEVOTHROID) 75 MCG tablet Take 75 mcg by mouth daily before breakfast.   02/10/2024   lisinopril (PRINIVIL,ZESTRIL) 10 MG tablet Take 10 mg by mouth daily.   02/10/2024   metFORMIN (GLUCOPHAGE) 1000 MG tablet Take 1,000 mg by mouth 2 (two) times daily.   Past Week   simvastatin (ZOCOR) 10 MG tablet Take 10 mg by mouth daily.   Past Week   fluticasone  (FLONASE ) 50 MCG/ACT nasal spray Place 2 sprays into both nostrils daily for 10 days. 16 g 0    methylPREDNISolone  (MEDROL  DOSEPAK) 4 MG TBPK tablet 6 day dose pack - take as directed (Patient not taking: Reported on 02/11/2024) 21 tablet 0 Not Taking   Nutritional Supplements (ESTROVEN PO) Take 1 tablet by mouth daily. (Patient not taking: Reported on 02/11/2024)   Not Taking     Allergies  Allergen Reactions   Daptomycin Hives   Dapsone Hives      Past Medical History:  Diagnosis Date   Arthritis    Cervical radiculopathy    Diabetes mellitus without complication (HCC)    Hypercholesterolemia    Hyperlipidemia    Hypertension    Hypothyroidism    Impingement syndrome of shoulder region     Review of systems:  Otherwise negative.    Physical Exam  Gen: Alert, oriented. Appears stated age.  HEENT: [place]/AT. PERRLA. Lungs: CTA, no wheezes. CV: RR nl S1, S2. Abd: soft, benign, no masses. BS+ Ext: No edema. Pulses 2+    Planned procedures: Proceed with colonoscopy. The patient understands the nature of the planned procedure, indications, risks, alternatives and potential complications including but not limited to bleeding, infection, perforation, damage to internal organs and possible oversedation/side effects from anesthesia. The patient agrees and gives consent to proceed.  Please refer to procedure notes for findings, recommendations and patient disposition/instructions.     Reka Wist K. Aundria, M.D. Gastroenterology 02/11/2024  10:04 AM

## 2024-02-11 NOTE — Anesthesia Postprocedure Evaluation (Signed)
 Anesthesia Post Note  Patient: Tyliah Schlereth  Procedure(s) Performed: COLONOSCOPY POLYPECTOMY, INTESTINE CONTROL OF HEMORRHAGE, GI TRACT, ENDOSCOPIC, BY CLIPPING OR OVERSEWING  Patient location during evaluation: Endoscopy Anesthesia Type: General Level of consciousness: awake and alert Pain management: pain level controlled Vital Signs Assessment: post-procedure vital signs reviewed and stable Respiratory status: spontaneous breathing, nonlabored ventilation and respiratory function stable Cardiovascular status: blood pressure returned to baseline and stable Postop Assessment: no apparent nausea or vomiting Anesthetic complications: no   No notable events documented.   Last Vitals:  Vitals:   02/11/24 1049 02/11/24 1059  BP: 105/62 (!) 96/56  Pulse: 82   Resp: 14 17  Temp:    SpO2: 94% 96%    Last Pain:  Vitals:   02/11/24 1039  TempSrc: Temporal                 Fairy POUR Morse Brueggemann

## 2024-02-11 NOTE — Op Note (Signed)
 [facility] Patient Name: Amanda Hill Procedure Date: 02/11/2024 10:13 AM MRN: [id number] Account #: [id number] Date of Birth: 12/10/1955 Admit Type: Outpatient Age: 68 Room: [facility] ENDO ROOM 1 Gender: Female Note Status: Finalized Instrument Name: Arvis [id number] Procedure:             Colonoscopy Indications:           High risk colon cancer surveillance: Personal history                         of colonic polyps Providers:             Vincenza Dail K. Aundria MD, MD Referring MD:          Alm HERO. Glover, MD (Referring MD) Medicines:             Propofol  per Anesthesia Complications:         No immediate complications. Estimated blood loss: None. Procedure:             Pre-Anesthesia Assessment:                        - The risks and benefits of the procedure and the                         sedation options and risks were discussed with the                         patient. All questions were answered and informed                         consent was obtained.                        - Patient identification and proposed procedure were                         verified prior to the procedure by the nurse. The                         procedure was verified in the procedure room.                        - ASA Grade Assessment: III - A patient with severe                         systemic disease.                        - After reviewing the risks and benefits, the patient                         was deemed in satisfactory condition to undergo the                         procedure.                        After obtaining informed consent, the colonoscope was  passed under direct vision. Throughout the procedure,                         the patient's blood pressure, pulse, and oxygen                         saturations were monitored continuously. The                         Colonoscope was introduced through the anus and                          advanced to the the cecum, identified by appendiceal                         orifice and ileocecal valve. The colonoscopy was                         performed without difficulty. The patient tolerated                         the procedure well. The quality of the bowel                         preparation was good. The ileocecal valve, appendiceal                         orifice, and rectum were photographed. Findings:      Non-bleeding internal hemorrhoids were found [Method Found].       [Size/Grade].      The perianal exam findings include internal hemorrhoids that do not       return to the anal canal, thus continuously prolapsed (Grade IV).      A few small-mouthed diverticula were found in the sigmoid colon.      A 20 mm polyp was found in the cecum. The polyp was carpet-like. Area       was successfully injected with 2 mL Eleview for a lift polypectomy. The       polyp was removed with a hot snare. Resection and retrieval were       complete. To prevent bleeding post-intervention, one hemostatic clip was       successfully placed (MR conditional). Clip manufacturer: [employer]. There was no bleeding during, or at the end, of the       procedure. Estimated blood loss: none.      The exam was otherwise without abnormality. Impression:            - Non-bleeding internal hemorrhoids.                        - Internal hemorrhoids that do not return to the anal                         canal, thus continuously prolapsed (Grade IV) found on                         perianal exam.                        -  Diverticulosis in the sigmoid colon.                        - One 20 mm polyp in the cecum, removed with a hot                         snare. Resected and retrieved. Injected. Clip (MR                         conditional) was placed. Clip manufacturer: [employer].                        - The examination was otherwise  normal. Recommendation:        - Patient has a contact number available for                         emergencies. The signs and symptoms of potential                         delayed complications were discussed with the patient.                         Return to normal activities tomorrow. Written                         discharge instructions were provided to the patient.                        - Resume previous diet.                        - Continue present medications.                        - Repeat colonoscopy is recommended for surveillance.                         The colonoscopy date will be determined after                         pathology results from today's exam become available                         for review.                        - Return to GI office PRN.                        - The findings and recommendations were discussed with                         the patient. Procedure Code(s):     --- Professional ---                        [phone], Colonoscopy, flexible; with removal of  tumor(s), polyp(s), or other lesion(s) by snare                         technique                        45381, Colonoscopy, flexible; with directed submucosal                         injection(s), any substance Diagnosis Code(s):     --- Professional ---                        K57.30, Diverticulosis of large intestine without                         perforation or abscess without bleeding                        D12.0, Benign neoplasm of cecum                        K64.3, Fourth degree hemorrhoids                        Z86.010, Personal history of colonic polyps CPT copyright 2022 [employer]. All rights reserved. The codes documented in this report are preliminary and upon coder review may  be revised to meet current compliance requirements. Ladell MARLA Boss MD, MD 02/11/2024 10:40:39 AM This report has been signed electronically. Number of  Addenda: 0 Note Initiated On: 02/11/2024 10:13 AM Scope Withdrawal Time: 0 hours 10 minutes 3 seconds  Total Procedure Duration: 0 hours 17 minutes 1 second  Estimated Blood Loss:  Estimated blood loss: none.      [facility], [place]

## 2024-02-11 NOTE — Interval H&P Note (Signed)
 History and Physical Interval Note:  02/11/2024 10:05 AM  Amanda Hill  has presented today for surgery, with the diagnosis of History of colon polyps (Z86.0100).  The various methods of treatment have been discussed with the patient and family. After consideration of risks, benefits and other options for treatment, the patient has consented to  Procedure(s) with comments: COLONOSCOPY (N/A) - DM, REQUESTS MID AM as a surgical intervention.  The patient's history has been reviewed, patient examined, no change in status, stable for surgery.  I have reviewed the patient's chart and labs.  Questions were answered to the patient's satisfaction.     [place], Arynn Armand

## 2024-02-11 NOTE — Transfer of Care (Signed)
 Immediate Anesthesia Transfer of Care Note  Patient: Amanda Hill  Procedure(s) Performed: COLONOSCOPY POLYPECTOMY, INTESTINE CONTROL OF HEMORRHAGE, GI TRACT, ENDOSCOPIC, BY CLIPPING OR OVERSEWING  Patient Location: PACU  Anesthesia Type:General  Level of Consciousness: sedated  Airway & Oxygen Therapy: Patient Spontanous Breathing  Post-op Assessment: Report given to RN and Post -op Vital signs reviewed and stable  Post vital signs: Reviewed and stable  Last Vitals:  Vitals Value Taken Time  BP 93/64 02/11/24 10:39  Temp 36 C 02/11/24 10:39  Pulse 95 02/11/24 10:39  Resp 17 02/11/24 10:39  SpO2 93 % 02/11/24 10:39    Last Pain:  Vitals:   02/11/24 1039  TempSrc: Temporal         Complications: No notable events documented.

## 2024-02-11 NOTE — Anesthesia Preprocedure Evaluation (Signed)
 Anesthesia Evaluation  Patient identified by MRN, date of birth, ID band Patient awake    Reviewed: Allergy & Precautions, NPO status , Patient's Chart, lab work & pertinent test results  History of Anesthesia Complications Negative for: history of anesthetic complications  Airway Mallampati: III  TM Distance: <3 FB Neck ROM: full    Dental  (+) Chipped   Pulmonary neg pulmonary ROS, neg shortness of breath   Pulmonary exam normal        Cardiovascular Exercise Tolerance: Good hypertension, (-) angina Normal cardiovascular exam     Neuro/Psych  Neuromuscular disease  negative psych ROS   GI/Hepatic negative GI ROS, Neg liver ROS,,,  Endo/Other  diabetesHypothyroidism    Renal/GU negative Renal ROS  negative genitourinary   Musculoskeletal   Abdominal   Peds  Hematology negative hematology ROS (+)   Anesthesia Other Findings Past Medical History: No date: Arthritis No date: Cervical radiculopathy No date: Diabetes mellitus without complication (HCC) No date: Hypercholesterolemia No date: Hyperlipidemia No date: Hypertension No date: Hypothyroidism No date: Impingement syndrome of shoulder region  Past Surgical History: No date: ABDOMINAL HYSTERECTOMY No date: APPENDECTOMY 8025,8014: BREAST EXCISIONAL BIOPSY; Right     Comment:  neg No date: c4-6 spine fusion; N/A 03/29/2016: CATARACT EXTRACTION W/PHACO; Left     Comment:  Procedure: CATARACT EXTRACTION PHACO AND INTRAOCULAR               LENS PLACEMENT (IOC);  Surgeon: Elsie Carmine, MD;                Location: [facility] ORS;  Service: Ophthalmology;  Laterality:              Left;  US  00:43 AP% 18.7 CDE 8.18 Fluid pack lot #               [id number] 04/24/2016: CATARACT EXTRACTION W/PHACO; Right     Comment:  Procedure: CATARACT EXTRACTION PHACO AND INTRAOCULAR               LENS PLACEMENT (IOC);  Surgeon: Elsie Carmine, MD;                 Location: [facility] ORS;  Service: Ophthalmology;  Laterality:              Right;  US     00:49.4 AP%     14.1 CDE      6.96 Fluid              pack lot # [id number] No date: CERVICAL SPINE SURGERY No date: CESAREAN SECTION No date: CHOLECYSTECTOMY 09/08/2018: COLONOSCOPY WITH PROPOFOL ; N/A     Comment:  Procedure: COLONOSCOPY WITH PROPOFOL ;  Surgeon: Viktoria Lamar DASEN, MD;  Location: [facility] ENDOSCOPY;  Service:               Endoscopy;  Laterality: N/A; No date: DILATION AND CURETTAGE OF UTERUS 09/08/2018: ESOPHAGOGASTRODUODENOSCOPY (EGD) WITH PROPOFOL ; N/A     Comment:  Procedure: ESOPHAGOGASTRODUODENOSCOPY (EGD) WITH               PROPOFOL ;  Surgeon: Viktoria Lamar DASEN, MD;  Location:               [facility] ENDOSCOPY;  Service: Endoscopy;  Laterality: N/A;  BMI    Body Mass Index: 37.91 kg/m      Reproductive/Obstetrics negative OB ROS  Anesthesia Physical Anesthesia Plan  ASA: 2  Anesthesia Plan: General   Post-op Pain Management:    Induction: Intravenous  PONV Risk Score and Plan: Propofol  infusion and TIVA  Airway Management Planned: Natural Airway and Nasal Cannula  Additional Equipment:   Intra-op Plan:   Post-operative Plan:   Informed Consent: I have reviewed the patients History and Physical, chart, labs and discussed the procedure including the risks, benefits and alternatives for the proposed anesthesia with the patient or authorized representative who has indicated his/her understanding and acceptance.     Dental Advisory Given  Plan Discussed with: Anesthesiologist, CRNA and Surgeon  Anesthesia Plan Comments: (Patient consented for risks of anesthesia including but not limited to:  - adverse reactions to medications - risk of airway placement if required - damage to eyes, teeth, lips or other oral mucosa - nerve damage due to positioning  - sore throat or hoarseness - Damage to heart,  brain, nerves, lungs, other parts of body or loss of life  Patient voiced understanding and assent.)       Anesthesia Quick Evaluation

## 2024-02-12 ENCOUNTER — Encounter: Payer: Self-pay | Admitting: Internal Medicine

## 2024-02-12 LAB — SURGICAL PATHOLOGY

## 2024-03-22 ENCOUNTER — Other Ambulatory Visit: Payer: Self-pay | Admitting: Podiatry

## 2024-06-01 ENCOUNTER — Other Ambulatory Visit: Payer: Self-pay | Admitting: Family Medicine

## 2024-06-01 DIAGNOSIS — Z1231 Encounter for screening mammogram for malignant neoplasm of breast: Secondary | ICD-10-CM

## 2024-07-20 ENCOUNTER — Ambulatory Visit
Admission: RE | Admit: 2024-07-20 | Discharge: 2024-07-20 | Disposition: A | Discharge status: Home / Self Care | Source: Ambulatory Visit | Attending: Family Medicine | Admitting: Family Medicine

## 2024-07-20 DIAGNOSIS — Z1231 Encounter for screening mammogram for malignant neoplasm of breast: Secondary | ICD-10-CM | POA: Diagnosis present
